# Patient Record
Sex: Female | Born: 1965 | Race: White | Hispanic: No | Marital: Married | Smoking: Current every day smoker
Health system: Southern US, Community
[De-identification: ages and names within clinical notes are randomized; demographics above are authoritative.]

## PROBLEM LIST (undated history)

## (undated) DIAGNOSIS — E079 Disorder of thyroid, unspecified: Secondary | ICD-10-CM

## (undated) DIAGNOSIS — G2581 Restless legs syndrome: Secondary | ICD-10-CM

## (undated) DIAGNOSIS — K589 Irritable bowel syndrome without diarrhea: Secondary | ICD-10-CM

## (undated) DIAGNOSIS — F419 Anxiety disorder, unspecified: Secondary | ICD-10-CM

## (undated) DIAGNOSIS — K56609 Unspecified intestinal obstruction, unspecified as to partial versus complete obstruction: Secondary | ICD-10-CM

## (undated) DIAGNOSIS — D259 Leiomyoma of uterus, unspecified: Secondary | ICD-10-CM

## (undated) HISTORY — DX: Leiomyoma of uterus, unspecified: D25.9

## (undated) HISTORY — DX: Unspecified intestinal obstruction, unspecified as to partial versus complete obstruction: K56.609

## (undated) HISTORY — PX: APPENDECTOMY: SHX54

## (undated) HISTORY — PX: CHOLECYSTECTOMY: SHX55

## (undated) HISTORY — DX: Disorder of thyroid, unspecified: E07.9

---

## 2004-03-25 ENCOUNTER — Ambulatory Visit (HOSPITAL_COMMUNITY): Admission: RE | Admit: 2004-03-25 | Discharge: 2004-03-25 | Payer: Self-pay | Admitting: Family Medicine

## 2004-03-31 ENCOUNTER — Emergency Department (HOSPITAL_COMMUNITY): Admission: EM | Admit: 2004-03-31 | Discharge: 2004-03-31 | Payer: Self-pay

## 2014-04-13 ENCOUNTER — Emergency Department (HOSPITAL_COMMUNITY)
Admission: EM | Admit: 2014-04-13 | Discharge: 2014-04-13 | Disposition: A | Discharge status: Home / Self Care | Payer: Medicaid Other | Attending: Emergency Medicine | Admitting: Emergency Medicine

## 2014-04-13 ENCOUNTER — Encounter (HOSPITAL_COMMUNITY): Payer: Self-pay | Admitting: Emergency Medicine

## 2014-04-13 DIAGNOSIS — M545 Low back pain, unspecified: Secondary | ICD-10-CM | POA: Diagnosis not present

## 2014-04-13 DIAGNOSIS — Z87891 Personal history of nicotine dependence: Secondary | ICD-10-CM | POA: Insufficient documentation

## 2014-04-13 DIAGNOSIS — M79609 Pain in unspecified limb: Secondary | ICD-10-CM | POA: Insufficient documentation

## 2014-04-13 DIAGNOSIS — B353 Tinea pedis: Secondary | ICD-10-CM | POA: Insufficient documentation

## 2014-04-13 HISTORY — DX: Irritable bowel syndrome, unspecified: K58.9

## 2014-04-13 MED ORDER — ONDANSETRON HCL 4 MG PO TABS
4.0000 mg | ORAL_TABLET | Freq: Once | ORAL | Status: AC
  Administered 2014-04-13: 4 mg via ORAL
  Filled 2014-04-13: qty 1

## 2014-04-13 MED ORDER — ACETAMINOPHEN-CODEINE #3 300-30 MG PO TABS
2.0000 | ORAL_TABLET | Freq: Once | ORAL | Status: AC
  Administered 2014-04-13: 2 via ORAL
  Filled 2014-04-13: qty 2

## 2014-04-13 MED ORDER — ACETAMINOPHEN-CODEINE #3 300-30 MG PO TABS: 1.0000 | ORAL_TABLET | Freq: Four times a day (QID) | ORAL | Status: DC | PRN

## 2014-04-13 NOTE — Discharge Instructions (Signed)
Your examination is consistent with athlete's foot involving the right foot. There's possibly some early secondary infection present. Please apply antifungal (Tinactin) powder 2 or 3 times during the day. Use antifungal cream to the area at night. Please apply a sock after you apply the cream at night. Please cleanse the area with soap and water daily. Please use clean white socks until the wound is completely healed.  Please see the orthopedic specialist listed above, or the orthopedic specialist of your choice for additional evaluations concerning your foot and your back. Use Tylenol for mild back pain, may use Tylenol codeine for more severe back pain. Tylenol codeine may cause drowsiness, please use with caution. Athlete's Foot Athlete's foot (tinea pedis) is a fungal infection of the skin on the feet. It often occurs on the skin between the toes or underneath the toes. It can also occur on the soles of the feet. Athlete's foot is more likely to occur in hot, humid weather. Not washing your feet or changing your socks often enough can contribute to athlete's foot. The infection can spread from person to person (contagious). CAUSES Athlete's foot is caused by a fungus. This fungus thrives in warm, moist places. Most people get athlete's foot by sharing shower stalls, towels, and wet floors with an infected person. People with weakened immune systems, including those with diabetes, may be more likely to get athlete's foot. SYMPTOMS   Itchy areas between the toes or on the soles of the feet.  White, flaky, or scaly areas between the toes or on the soles of the feet.  Tiny, intensely itchy blisters between the toes or on the soles of the feet.  Tiny cuts on the skin. These cuts can develop a bacterial infection.  Thick or discolored toenails. DIAGNOSIS  Your caregiver can usually tell what the problem is by doing a physical exam. Your caregiver may also take a skin sample from the rash area. The  skin sample may be examined under a microscope, or it may be tested to see if fungus will grow in the sample. A sample may also be taken from your toenail for testing. TREATMENT  Over-the-counter and prescription medicines can be used to kill the fungus. These medicines are available as powders or creams. Your caregiver can suggest medicines for you. Fungal infections respond slowly to treatment. You may need to continue using your medicine for several weeks. PREVENTION   Do not share towels.  Wear sandals in wet areas, such as shared locker rooms and shared showers.  Keep your feet dry. Wear shoes that allow air to circulate. Wear cotton or wool socks. HOME CARE INSTRUCTIONS   Take medicines as directed by your caregiver. Do not use steroid creams on athlete's foot.  Keep your feet clean and cool. Wash your feet daily and dry them thoroughly, especially between your toes.  Change your socks every day. Wear cotton or wool socks. In hot climates, you may need to change your socks 2 to 3 times per day.  Wear sandals or canvas tennis shoes with good air circulation.  If you have blisters, soak your feet in Burow's solution or Epsom salts for 20 to 30 minutes, 2 times a day to dry out the blisters. Make sure you dry your feet thoroughly afterward. SEEK MEDICAL CARE IF:   You have a fever.  You have swelling, soreness, warmth, or redness in your foot.  You are not getting better after 7 days of treatment.  You are not completely  cured after 30 days.  You have any problems caused by your medicines. MAKE SURE YOU:   Understand these instructions.  Will watch your condition.  Will get help right away if you are not doing well or get worse. Document Released: 08/07/2000 Document Revised: 11/02/2011 Document Reviewed: 05/29/2011 [facility] Patient Information 2015 [place], [place]. This information is not intended to replace advice given to you by your health care provider. Make sure you  discuss any questions you have with your health care provider.

## 2014-04-13 NOTE — ED Provider Notes (Signed)
CSN: [id number]     Arrival date & time 04/13/14  2032 History   First MD Initiated Contact with Patient 04/13/14 2115     Chief Complaint  Patient presents with  . Foot Pain     (Consider location/radiation/quality/duration/timing/severity/associated sxs/prior Treatment) HPI Comments: Patient is a 2 your old female who presents to the emergency department with a right foot and ankle pain for approximately one month. The patient states that she noted a small area of increased redness at the sole of her right foot. She has been trying alcohol, peroxide, various sprays and appointments, and the area remains red and itching for nearly a month. The patient is also concerned that at times she walks on her foot the foot on one side or the other, and it is now beginning to bother her back. The patient presents now for evaluation of the red area of her foot, and also to raise questions about why she walks away she does. There's been no drainage from the rib area. There's been no history of any temperature elevations. The patient denies seeing any red streaking involving the foot.  Patient is a 48 y.o. female presenting with lower extremity pain. The history is provided by the patient and the spouse.  Foot Pain Pertinent negatives include no abdominal pain, arthralgias, chest pain, coughing or neck pain.    Past Medical History  Diagnosis Date  . IBS (irritable bowel syndrome)    Past Surgical History  Procedure Laterality Date  . Cholecystectomy    . Appendectomy     No family history on file. History  Substance Use Topics  . Smoking status: Former Research scientist (life sciences)  . Smokeless tobacco: Not on file  . Alcohol Use: No   OB History   Grav Para Term Preterm Abortions TAB SAB Ect Mult Living                 Review of Systems  Constitutional: Negative for activity change.       All ROS Neg except as noted in HPI  HENT: Negative for nosebleeds.   Eyes: Negative for photophobia and discharge.   Respiratory: Negative for cough, shortness of breath and wheezing.   Cardiovascular: Negative for chest pain and palpitations.  Gastrointestinal: Negative for abdominal pain and blood in stool.  Genitourinary: Negative for dysuria, frequency and hematuria.  Musculoskeletal: Positive for back pain. Negative for arthralgias and neck pain.  Skin: Negative.   Neurological: Negative for dizziness, seizures and speech difficulty.  Psychiatric/Behavioral: Negative for hallucinations and confusion.      Allergies  Aspirin  Home Medications   Prior to Admission medications   Not on File   BP 133/62  Pulse 95  Temp(Src) 98.8 F (37.1 C)  Resp 20  Ht 5\' 9"  (1.753 m)  Wt 313 lb (141.976 kg)  BMI 46.20 kg/m2  SpO2 99% Physical Exam  Nursing note and vitals reviewed. Constitutional: She is oriented to person, place, and time. She appears well-developed and well-nourished.  Non-toxic appearance.  HENT:  Head: Normocephalic.  Right Ear: Tympanic membrane and external ear normal.  Left Ear: Tympanic membrane and external ear normal.  Eyes: EOM and lids are normal. Pupils are equal, round, and reactive to light.  Neck: Normal range of motion. Neck supple. Carotid bruit is not present.  Cardiovascular: Normal rate, regular rhythm, normal heart sounds, intact distal pulses and normal pulses.   Pulmonary/Chest: Breath sounds normal. No respiratory distress.  Abdominal: Soft. Bowel sounds are normal. There is  no tenderness. There is no guarding.  Musculoskeletal: Normal range of motion.       Lumbar back: She exhibits pain.  There is good range of motion of the right hip knee and ankle. The Achilles tendon is intact. The posterior tibial and dorsalis pedis pulses are 2+. There no lesions noted between the toes. The patient has callus formation on the metatarsal heads at the plantar surface. There is an area of increased redness at the midfoot. There is dry scaling areas at the center of the  red area, with raised red edges present. There is also some new skin granulation noted at the area. There is no red streaks appreciated. There is no drainage appreciated.  Lymphadenopathy:       Head (right side): No submandibular adenopathy present.       Head (left side): No submandibular adenopathy present.    She has no cervical adenopathy.  Neurological: She is alert and oriented to person, place, and time. She has normal strength. No cranial nerve deficit or sensory deficit.  Skin: Skin is warm and dry.  Psychiatric: She has a normal mood and affect. Her speech is normal.    ED Course  Procedures (including critical care time) Labs Review Labs Reviewed - No data to display  Imaging Review No results found.   EKG Interpretation None      MDM The examination is consistent with tinea pedis. There is no red streaking or drainage appreciated. The temperature is 98.8. There is no tachycardia present. I have suggested to the patient that she use antifungal powder 2 or 3 times daily to the area. I've also suggested that she use antifungal cream with a sock in place at night until this area resolves. The patient has pain of the lumbar area. There is no palpable step off, no hot areas appreciated. Patient is treated with Tylenol codeine, and advised to see her primary physician for additional evaluation of this problem.    Final diagnoses:  Tinea pedis of right foot  Low back pain without sciatica, unspecified back pain laterality    **I have reviewed nursing notes, vital signs, and all appropriate lab and imaging results for this patient.Lenox Ahr, PA-C 04/14/14 1610

## 2014-04-13 NOTE — ED Notes (Signed)
Patient with no complaints at this time. Respirations even and unlabored. Skin warm/dry. Discharge instructions reviewed with patient at this time. Patient given opportunity to voice concerns/ask questions. Patient discharged at this time and left Emergency Department with steady gait.   

## 2014-04-13 NOTE — ED Notes (Addendum)
Pt c/o right foot and ankle pain x 1 month. Pt has a red circular area on the sole of her foot.

## 2014-04-15 NOTE — ED Provider Notes (Signed)
Medical screening examination/treatment/procedure(s) were performed by non-physician practitioner and as supervising physician I was immediately available for consultation/collaboration.     Veryl Speak, MD 04/15/14 2039

## 2015-10-05 ENCOUNTER — Emergency Department (HOSPITAL_COMMUNITY)
Admission: EM | Admit: 2015-10-05 | Discharge: 2015-10-05 | Disposition: A | Discharge status: Home / Self Care | Payer: Medicaid Other | Attending: Emergency Medicine | Admitting: Emergency Medicine

## 2015-10-05 ENCOUNTER — Emergency Department (HOSPITAL_COMMUNITY): Payer: Medicaid Other

## 2015-10-05 ENCOUNTER — Encounter (HOSPITAL_COMMUNITY): Payer: Self-pay

## 2015-10-05 DIAGNOSIS — S20211A Contusion of right front wall of thorax, initial encounter: Secondary | ICD-10-CM

## 2015-10-05 DIAGNOSIS — Z791 Long term (current) use of non-steroidal anti-inflammatories (NSAID): Secondary | ICD-10-CM | POA: Insufficient documentation

## 2015-10-05 DIAGNOSIS — Z87891 Personal history of nicotine dependence: Secondary | ICD-10-CM | POA: Diagnosis not present

## 2015-10-05 DIAGNOSIS — Z79899 Other long term (current) drug therapy: Secondary | ICD-10-CM | POA: Diagnosis not present

## 2015-10-05 DIAGNOSIS — Y9389 Activity, other specified: Secondary | ICD-10-CM | POA: Insufficient documentation

## 2015-10-05 DIAGNOSIS — S29001A Unspecified injury of muscle and tendon of front wall of thorax, initial encounter: Secondary | ICD-10-CM | POA: Diagnosis present

## 2015-10-05 DIAGNOSIS — W010XXA Fall on same level from slipping, tripping and stumbling without subsequent striking against object, initial encounter: Secondary | ICD-10-CM | POA: Diagnosis not present

## 2015-10-05 DIAGNOSIS — Y998 Other external cause status: Secondary | ICD-10-CM | POA: Diagnosis not present

## 2015-10-05 DIAGNOSIS — Y9289 Other specified places as the place of occurrence of the external cause: Secondary | ICD-10-CM | POA: Diagnosis not present

## 2015-10-05 DIAGNOSIS — Z8719 Personal history of other diseases of the digestive system: Secondary | ICD-10-CM | POA: Insufficient documentation

## 2015-10-05 MED ORDER — OXYCODONE-ACETAMINOPHEN 5-325 MG PO TABS
1.0000 | ORAL_TABLET | Freq: Once | ORAL | Status: AC
  Administered 2015-10-05: 1 via ORAL
  Filled 2015-10-05: qty 1

## 2015-10-05 MED ORDER — HYDROCODONE-ACETAMINOPHEN 5-325 MG PO TABS: 1.0000 | ORAL_TABLET | ORAL | Status: DC | PRN

## 2015-10-05 NOTE — ED Notes (Signed)
I tripped over some shoes and fell. Having pain in my right ribs per pt.

## 2015-10-05 NOTE — ED Provider Notes (Signed)
CSN: [id number]     Arrival date & time 10/05/15  2117 History   First MD Initiated Contact with Patient 10/05/15 2210     Chief Complaint  Patient presents with  . Rib Injury     (Consider location/radiation/quality/duration/timing/severity/associated sxs/prior Treatment) Patient is a 50 y.o. female presenting with chest pain. The history is provided by the patient.  Chest Pain Pain location:  R chest Pain quality: aching   Pain radiates to:  Does not radiate Pain radiates to the back: no   Pain severity:  Moderate Onset quality:  Sudden Duration:  5 hours Timing:  Constant Progression:  Worsening Chronicity:  New Relieved by:  Nothing Worsened by:  Coughing, deep breathing and movement  Sharon Macdonald is a 50 y.o. female who presents to the ED with right rib pain. She reports tripping over shoes and falling on to her right side. Her right anterior ribs hit her husband's knee.    Past Medical History  Diagnosis Date  . IBS (irritable bowel syndrome)    Past Surgical History  Procedure Laterality Date  . Cholecystectomy    . Appendectomy     No family history on file. Social History  Substance Use Topics  . Smoking status: Former Research scientist (life sciences)  . Smokeless tobacco: None  . Alcohol Use: No   OB History    No data available     Review of Systems  Cardiovascular: Positive for chest pain (right rib pain).  all other systems negataive    Allergies  Aspirin  Home Medications   Prior to Admission medications   Medication Sig Start Date End Date Taking? Authorizing Provider  albuterol (PROVENTIL HFA;VENTOLIN HFA) 108 (90 Base) MCG/ACT inhaler Inhale 2 puffs into the lungs every 6 (six) hours as needed for wheezing or shortness of breath.   Yes Historical Provider, MD  gabapentin (NEURONTIN) 300 MG capsule Take 300 mg by mouth at bedtime.   Yes Historical Provider, MD  naproxen (NAPROSYN) 500 MG tablet Take 500 mg by mouth daily.   Yes Historical Provider, MD    HYDROcodone-acetaminophen (NORCO/VICODIN) 5-325 MG tablet Take 1 tablet by mouth every 4 (four) hours as needed. 10/05/15   Luie Laneve Bunnie Pion, NP   BP 116/69 mmHg  Pulse 78  Temp(Src) 98.1 F (36.7 C) (Oral)  Resp 24  Ht 5\' 9"  (1.753 m)  Wt 146.557 kg  BMI 47.69 kg/m2  SpO2 93%  LMP 08/30/2015 (Approximate) Physical Exam  Constitutional: She is oriented to person, place, and time. She appears well-developed and well-nourished.  HENT:  Head: Normocephalic and atraumatic.  Eyes: Conjunctivae and EOM are normal.  Neck: Normal range of motion. Neck supple.  Cardiovascular: Normal rate and regular rhythm.   Pulmonary/Chest: Effort normal and breath sounds normal. She exhibits tenderness. She exhibits no laceration, no deformity and no swelling.    Abdominal: Soft. Bowel sounds are normal. There is no tenderness.  Musculoskeletal: Normal range of motion.  Neurological: She is alert and oriented to person, place, and time. She has normal strength. No cranial nerve deficit. Gait normal.  Skin: Skin is warm and dry.  Psychiatric: She has a normal mood and affect. Her behavior is normal.  Nursing note and vitals reviewed.   ED Course  Procedures (including critical care time) Labs Review Labs Reviewed - No data to display  Imaging Review Dg Ribs Unilateral W/chest Right  10/05/2015  CLINICAL DATA:  Tripped over spouse's foot and fell, hitting husband's knee. RIGHT rib pain. EXAM: RIGHT  RIBS AND CHEST - 3+ VIEW COMPARISON:  Thoracic spine radiographs July 31, 2015 FINDINGS: No fracture or other bone lesions are seen involving the ribs. There is no evidence of pneumothorax or pleural effusion. Both lungs are clear. Heart size and mediastinal contours are within normal limits. Surgical clips in the included right abdomen compatible with cholecystectomy. IMPRESSION: Negative. Electronically Signed   By: Elon Alas M.D.   On: 10/05/2015 22:29    MDM  51 y.o. female with right rib pain  s/p fall earlier today stable for d/c without fracture identified on x-ray and no respiratory distress, normal breath sound. Discussed with the patient and all questioned fully answered. She will return if any problems arise. Will treat with Hydrocodone for pain and she will continue to take the Naprosyn.   Final diagnoses:  Rib contusion, right, initial encounter      [facility], NP 10/05/15 2303  Virgel Manifold, MD 10/08/15 1339

## 2015-10-05 NOTE — Discharge Instructions (Signed)
Do not drive while taking the narcotic as it will make you sleepy. Continue to take the Naprosyn.

## 2016-04-27 ENCOUNTER — Encounter (HOSPITAL_COMMUNITY): Payer: Self-pay | Admitting: Emergency Medicine

## 2016-04-27 ENCOUNTER — Emergency Department (HOSPITAL_COMMUNITY)
Admission: EM | Admit: 2016-04-27 | Discharge: 2016-04-27 | Disposition: A | Discharge status: Home / Self Care | Payer: Medicaid Other | Attending: Emergency Medicine | Admitting: Emergency Medicine

## 2016-04-27 DIAGNOSIS — Z79899 Other long term (current) drug therapy: Secondary | ICD-10-CM | POA: Insufficient documentation

## 2016-04-27 DIAGNOSIS — M79604 Pain in right leg: Secondary | ICD-10-CM | POA: Diagnosis not present

## 2016-04-27 DIAGNOSIS — F17210 Nicotine dependence, cigarettes, uncomplicated: Secondary | ICD-10-CM | POA: Insufficient documentation

## 2016-04-27 DIAGNOSIS — H9203 Otalgia, bilateral: Secondary | ICD-10-CM | POA: Insufficient documentation

## 2016-04-27 DIAGNOSIS — M79605 Pain in left leg: Secondary | ICD-10-CM | POA: Insufficient documentation

## 2016-04-27 HISTORY — DX: Restless legs syndrome: G25.81

## 2016-04-27 MED ORDER — DEXAMETHASONE SODIUM PHOSPHATE 4 MG/ML IJ SOLN
8.0000 mg | Freq: Once | INTRAMUSCULAR | Status: AC
  Administered 2016-04-27: 8 mg via INTRAMUSCULAR
  Filled 2016-04-27: qty 2

## 2016-04-27 MED ORDER — DIPHENHYDRAMINE HCL 25 MG PO CAPS: 25.0000 mg | ORAL_CAPSULE | Freq: Four times a day (QID) | ORAL | 0 refills | Status: DC | PRN

## 2016-04-27 MED ORDER — TRAMADOL HCL 50 MG PO TABS
50.0000 mg | ORAL_TABLET | Freq: Once | ORAL | Status: AC
Start: 2016-04-27 — End: 2016-04-27
  Administered 2016-04-27: 50 mg via ORAL
  Filled 2016-04-27: qty 1

## 2016-04-27 MED ORDER — DIPHENHYDRAMINE HCL 25 MG PO CAPS
25.0000 mg | ORAL_CAPSULE | Freq: Once | ORAL | Status: AC
  Administered 2016-04-27: 25 mg via ORAL
  Filled 2016-04-27: qty 1

## 2016-04-27 MED ORDER — TRAMADOL HCL 50 MG PO TABS: 50.0000 mg | ORAL_TABLET | Freq: Four times a day (QID) | ORAL | 0 refills | Status: DC | PRN

## 2016-04-27 NOTE — ED Provider Notes (Signed)
[place] DEPT Provider Note   CSN: [id number] Arrival date & time: 04/27/16  1347  By signing my name below, I, Shanna Cisco, attest that this documentation has been prepared under the direction and in the presence of Lily Kocher, PA-C. Electronically signed by: Shanna Cisco, ED Scribe. 04/27/16. 3:37 PM.   History   Chief Complaint Chief Complaint  Patient presents with  . Leg Pain  . Otalgia   The history is provided by the patient. No language interpreter was used.   HPI Comments:  Sharon Macdonald is a 50 y.o. female with a PMHx of restless leg syndrome who presents to the Emergency Department complaining of bilateral leg pain, which started several days ago. Pain is described as burning sensation. No associated symptoms reported. She reports that pain is exacerbated with weight bearing. Pt is prescribed Gabapentin, which does not provide relief. Denies numbness or tingling, recent fever, chills or injuries.  Pt also complains of bilateral otalgia, which started 2 days ago. History of allergies and sinus problems. She believes that she may have a sore in the ear. No associated symptoms or modifying factors reported. Denies fever, drainage.   Past Medical History:  Diagnosis Date  . IBS (irritable bowel syndrome)   . Restless leg syndrome     There are no active problems to display for this patient.   Past Surgical History:  Procedure Laterality Date  . APPENDECTOMY    . CHOLECYSTECTOMY      OB History    Gravida Para Term Preterm AB Living   4 4 4          SAB TAB Ectopic Multiple Live Births                   Home Medications    Prior to Admission medications   Medication Sig Start Date End Date Taking? Authorizing Provider  albuterol (PROVENTIL HFA;VENTOLIN HFA) 108 (90 Base) MCG/ACT inhaler Inhale 2 puffs into the lungs every 6 (six) hours as needed for wheezing or shortness of breath.    Historical Provider, MD  gabapentin (NEURONTIN) 300 MG capsule Take  300 mg by mouth at bedtime.    Historical Provider, MD  HYDROcodone-acetaminophen (NORCO/VICODIN) 5-325 MG tablet Take 1 tablet by mouth every 4 (four) hours as needed. 10/05/15   Hope Bunnie Pion, NP  naproxen (NAPROSYN) 500 MG tablet Take 500 mg by mouth daily.    Historical Provider, MD    Family History Family History  Problem Relation Age of Onset  . Cancer Mother     Social History Social History  Substance Use Topics  . Smoking status: Current Every Day Smoker    Types: E-cigarettes  . Smokeless tobacco: Never Used  . Alcohol use No     Allergies   Aspirin   Review of Systems Review of Systems  Constitutional: Negative for chills and fever.  Musculoskeletal: Positive for arthralgias and myalgias.  Neurological: Negative for weakness.  All other systems reviewed and are negative.    Physical Exam Updated Vital Signs BP 148/73 (BP Location: Left Arm)   Pulse 65   Temp 98.1 F (36.7 C) (Oral)   Resp 18   Ht 5\' 9"  (1.753 m)   Wt (!) 305 lb 8 oz (138.6 kg)   LMP 08/30/2015 (Approximate)   SpO2 96%   BMI 45.11 kg/m   Physical Exam  Constitutional: She is oriented to person, place, and time. She appears well-developed and well-nourished.  HENT:  Head: Normocephalic and  atraumatic.  Mouth/Throat: Oropharynx is clear and moist.  No pre or post auricular nodes palpated. Right TM is within normal limits.  Mild to moderate fluid behind TM in the left ear. Tenderness over TMJ on right. No TMJ tenderness on left.   Eyes: Conjunctivae and EOM are normal. Pupils are equal, round, and reactive to light.  Neck: Normal range of motion.  Cardiovascular: Normal rate, regular rhythm and normal heart sounds.   Pulses:      Dorsalis pedis pulses are 2+ on the right side, and 2+ on the left side.  Pulmonary/Chest: Effort normal and breath sounds normal.  Abdominal: Soft. Bowel sounds are normal.  Musculoskeletal: Normal range of motion.  No temperature changes of lower  extremities. Crepitus with ROM of right and left knee. No effusion; no hot joints; no posterior masses.  Lymphadenopathy:    She has no cervical adenopathy.  Neurological: She is alert and oriented to person, place, and time.  Skin: Skin is warm and dry.  Psychiatric: She has a normal mood and affect.  Nursing note and vitals reviewed.   ED Treatments / Results  DIAGNOSTIC STUDIES:  Oxygen Saturation is 96% on room air, normal by my interpretation.    COORDINATION OF CARE:  3:35 PM Discussed treatment plan with pt at bedside and pt agreed to plan.  Labs (all labs ordered are listed, but only abnormal results are displayed) Labs Reviewed - No data to display  EKG  EKG Interpretation None       Radiology No results found.  Procedures Procedures (including critical care time)  Medications Ordered in ED Medications - No data to display   Initial Impression / Assessment and Plan / ED Course  I have reviewed the triage vital signs and the nursing notes.  Pertinent labs & imaging results that were available during my care of the patient were reviewed by me and considered in my medical decision making (see chart for details).  Clinical Course    **I have reviewed nursing notes, vital signs, and all appropriate lab and imaging results for this patient.*  Final Clinical Impressions(s) / ED Diagnoses  The vital signs within normal limits. The examination suggest bilateral otalgia, and right leg pain. I suspect that the patient has advancing degenerative joint disease that is aggravating restless leg syndrome. Patient advised to see her Medicaid access physician for additional evaluation and management. Patient is to return to the emergency department if any changes, or emergent  Problem. Pt to see Dr Benjamine Mola if not improving. Patient is in agreement with this discharge plan.    Final diagnoses:  Otalgia, bilateral  Right leg pain    New Prescriptions New Prescriptions     No medications on file  **I personally performed the services described in this documentation, which was scribed in my presence. The recorded information has been reviewed and is accurate.Lily Kocher, PA-C 04/30/16 [place], MD 05/01/16 [phone]

## 2016-04-27 NOTE — ED Notes (Signed)
Pt reports bilateral leg pain, hx of RLS. "My medication doesn't work anymore". Also c/o bilateral ear pain X2 months.

## 2016-04-27 NOTE — ED Triage Notes (Signed)
Pt c/o leg pain, states she has restless leg syndrome. Pt also c/o bilat otalgia.

## 2016-04-27 NOTE — Discharge Instructions (Addendum)
There is a mild to moderate amount of fluid behind the ear drum on the left. There is soreness over your right and left temporomandibular joints on. Please use in a drill for the congestion and fluid. Please use Tylenol for mild pain, use Ultram for more severe pain. The examination of your lower extremities reveals advanced arthritis changes. You were given intramuscular Decadron to assist with this discomfort on. Please discuss this problem and your restless leg issues with Dr. Lorriane Shire as soon as possible.

## 2017-01-11 ENCOUNTER — Emergency Department (HOSPITAL_COMMUNITY)
Admission: EM | Admit: 2017-01-11 | Discharge: 2017-01-11 | Disposition: A | Discharge status: Home / Self Care | Payer: Medicaid Other | Attending: Emergency Medicine | Admitting: Emergency Medicine

## 2017-01-11 ENCOUNTER — Encounter (HOSPITAL_COMMUNITY): Payer: Self-pay

## 2017-01-11 DIAGNOSIS — R197 Diarrhea, unspecified: Secondary | ICD-10-CM | POA: Diagnosis not present

## 2017-01-11 DIAGNOSIS — R110 Nausea: Secondary | ICD-10-CM | POA: Diagnosis not present

## 2017-01-11 DIAGNOSIS — Z79899 Other long term (current) drug therapy: Secondary | ICD-10-CM | POA: Diagnosis not present

## 2017-01-11 DIAGNOSIS — F17210 Nicotine dependence, cigarettes, uncomplicated: Secondary | ICD-10-CM | POA: Diagnosis not present

## 2017-01-11 DIAGNOSIS — R1031 Right lower quadrant pain: Secondary | ICD-10-CM | POA: Insufficient documentation

## 2017-01-11 DIAGNOSIS — R748 Abnormal levels of other serum enzymes: Secondary | ICD-10-CM | POA: Insufficient documentation

## 2017-01-11 DIAGNOSIS — R109 Unspecified abdominal pain: Secondary | ICD-10-CM

## 2017-01-11 LAB — COMPREHENSIVE METABOLIC PANEL
ALBUMIN: 3.7 g/dL (ref 3.5–5.0)
ALT: 99 U/L — AB (ref 14–54)
AST: 152 U/L — AB (ref 15–41)
Alkaline Phosphatase: 143 U/L — ABNORMAL HIGH (ref 38–126)
Anion gap: 9 (ref 5–15)
BILIRUBIN TOTAL: 2 mg/dL — AB (ref 0.3–1.2)
BUN: 7 mg/dL (ref 6–20)
CO2: 24 mmol/L (ref 22–32)
Calcium: 8.5 mg/dL — ABNORMAL LOW (ref 8.9–10.3)
Chloride: 98 mmol/L — ABNORMAL LOW (ref 101–111)
Creatinine, Ser: 0.73 mg/dL (ref 0.44–1.00)
GFR calc Af Amer: >60 mL/min (ref >60)
GLUCOSE: 102 mg/dL — AB (ref 65–99)
POTASSIUM: 4 mmol/L (ref 3.5–5.1)
Sodium: 131 mmol/L — ABNORMAL LOW (ref 135–145)
Total Protein: 8.1 g/dL (ref 6.5–8.1)

## 2017-01-11 LAB — CBC
HEMATOCRIT: 39.3 % (ref 36.0–46.0)
HEMOGLOBIN: 13.5 g/dL (ref 12.0–15.0)
MCH: 30.3 pg (ref 26.0–34.0)
MCHC: 34.4 g/dL (ref 30.0–36.0)
MCV: 88.3 fL (ref 78.0–100.0)
Platelets: 197 10*3/uL (ref 150–400)
RBC: 4.45 MIL/uL (ref 3.87–5.11)
RDW: 13.1 % (ref 11.5–15.5)
WBC: 10.6 10*3/uL — ABNORMAL HIGH (ref 4.0–10.5)

## 2017-01-11 LAB — ACETAMINOPHEN LEVEL: Acetaminophen (Tylenol), Serum: <10 ug/mL — ABNORMAL LOW (ref 10–30)

## 2017-01-11 LAB — LIPASE, BLOOD: Lipase: 40 U/L (ref 11–51)

## 2017-01-11 MED ORDER — DICYCLOMINE HCL 10 MG PO CAPS
10.0000 mg | ORAL_CAPSULE | Freq: Once | ORAL | Status: AC
  Administered 2017-01-11: 10 mg via ORAL
  Filled 2017-01-11: qty 1

## 2017-01-11 MED ORDER — ONDANSETRON HCL 4 MG PO TABS
4.0000 mg | ORAL_TABLET | Freq: Once | ORAL | Status: AC
  Administered 2017-01-11: 4 mg via ORAL
  Filled 2017-01-11: qty 1

## 2017-01-11 MED ORDER — DIPHENOXYLATE-ATROPINE 2.5-0.025 MG PO TABS
1.0000 | ORAL_TABLET | Freq: Once | ORAL | Status: AC
  Administered 2017-01-11: 1 via ORAL
  Filled 2017-01-11: qty 1

## 2017-01-11 MED ORDER — DICYCLOMINE HCL 20 MG PO TABS: 20.0000 mg | ORAL_TABLET | Freq: Two times a day (BID) | ORAL | 0 refills | Status: DC | PRN

## 2017-01-11 MED ORDER — ONDANSETRON HCL 4 MG/2ML IJ SOLN: 4.0000 mg | Freq: Once | INTRAMUSCULAR | Status: DC

## 2017-01-11 MED ORDER — ONDANSETRON HCL 4 MG PO TABS: 4.0000 mg | ORAL_TABLET | Freq: Three times a day (TID) | ORAL | 0 refills | Status: DC | PRN

## 2017-01-11 NOTE — ED Provider Notes (Signed)
[facility] DEPT Provider Note   CSN: [id number] Arrival date & time: 01/11/17  2053 By signing my name below, I, Sharon Macdonald, attest that this documentation has been prepared under the direction and in the presence of Sharon Macdonald, Corene Cornea, MD . Electronically Signed: Dyke Macdonald, Scribe. 01/11/2017. 9:32 PM.   History   Chief Complaint Chief Complaint  Patient presents with  . Abdominal Pain  . Diarrhea    HPI Sharon Macdonald is a 51 y.o. female with a history of IBS who presents to the Emergency Department complaining of intermittent, moderate RLQ pain onset this morning. Pt reports associated nausea and frequent NBNB diarrhea. Per pt, she has had over 10 episodes of diarrhea today.  Pt states her abdominal pain is alleviated by having bowel movements and is exacerbated by direct palpation. She has taken 3 Imodium today with no relief. Per pt, her IBS symptoms are typically relieved after taking Imodium. No recent sick contact. She denies the use of any anti-coagulants. No recent foreign travel. No suspicious food intake. No recent abx use. She denies any vomiting or fever.   The history is provided by the patient. No language interpreter was used.   Past Medical History:  Diagnosis Date  . IBS (irritable bowel syndrome)   . Restless leg syndrome     There are no active problems to display for this patient.  Past Surgical History:  Procedure Laterality Date  . APPENDECTOMY    . CHOLECYSTECTOMY      OB History    Gravida Para Term Preterm AB Living   4 4 4          SAB TAB Ectopic Multiple Live Births                 Home Medications    Prior to Admission medications   Medication Sig Start Date End Date Taking? Authorizing Provider  albuterol (PROAIR HFA) 108 (90 Base) MCG/ACT inhaler Inhale 1-2 puffs into the lungs every 6 (six) hours as needed for wheezing or shortness of breath.   Yes [provider]  gabapentin (NEURONTIN) 300 MG capsule Take 300 mg by  mouth at bedtime.   Yes [provider]  loperamide (IMODIUM A-D) 2 MG tablet Take 2 mg by mouth 4 (four) times daily as needed for diarrhea or loose stools.   Yes [provider]  dicyclomine (BENTYL) 20 MG tablet Take 1 tablet (20 mg total) by mouth 2 (two) times daily as needed for spasms. 01/11/17   Oluwadamilola Rosamond, Corene Cornea, MD  ondansetron (ZOFRAN) 4 MG tablet Take 1 tablet (4 mg total) by mouth every 8 (eight) hours as needed for nausea or vomiting. 01/11/17   Autrey Human, Corene Cornea, MD    Family History Family History  Problem Relation Age of Onset  . Cancer Mother     Social History Social History  Substance Use Topics  . Smoking status: Current Every Day Smoker    Types: E-cigarettes  . Smokeless tobacco: Never Used  . Alcohol use No    Allergies   Aspirin  Review of Systems Review of Systems  Constitutional: Negative for fever.  Gastrointestinal: Positive for abdominal pain, diarrhea and nausea. Negative for vomiting.  All other systems reviewed and are negative.  Physical Exam Updated Vital Signs BP (!) 133/54   Pulse 88   Temp 99.1 F (37.3 C) (Oral)   Resp 17   Wt 134.2 kg (295 lb 13.7 oz)   LMP 08/30/2015 (Approximate)   SpO2 97%  BMI 43.69 kg/m   Physical Exam  Constitutional: She is oriented to person, place, and time. She appears well-developed and well-nourished. No distress.  HENT:  Head: Normocephalic and atraumatic.  Moist mucus membranes  Eyes: Conjunctivae are normal.  Cardiovascular: Normal rate.   Pulmonary/Chest: Effort normal.  Abdominal: Soft. Bowel sounds are normal. She exhibits no distension and no mass. There is no tenderness. There is no rebound and no guarding.  Musculoskeletal: Normal range of motion. She exhibits no edema or deformity.  Neurological: She is alert and oriented to person, place, and time.  Skin: Skin is warm and dry.  Psychiatric: She has a normal mood and affect.  Nursing note and vitals reviewed.  ED  Treatments / Results  DIAGNOSTIC STUDIES:  Oxygen Saturation is 100% on RA, normal by my interpretation.    COORDINATION OF CARE:  9:28 PM Discussed treatment plan with pt at bedside and pt agreed to plan.   Labs (all labs ordered are listed, but only abnormal results are displayed) Labs Reviewed  COMPREHENSIVE METABOLIC PANEL - Abnormal; Notable for the following:       Result Value   Sodium 131 (*)    Chloride 98 (*)    Glucose, Bld 102 (*)    Calcium 8.5 (*)    AST 152 (*)    ALT 99 (*)    Alkaline Phosphatase 143 (*)    Total Bilirubin 2.0 (*)    All other components within normal limits  CBC - Abnormal; Notable for the following:    WBC 10.6 (*)    All other components within normal limits  ACETAMINOPHEN LEVEL - Abnormal; Notable for the following:    Acetaminophen (Tylenol), Serum <10 (*)    All other components within normal limits  LIPASE, BLOOD  URINALYSIS, ROUTINE W REFLEX MICROSCOPIC  HEPATITIS PANEL, ACUTE    EKG  EKG Interpretation None       Radiology No results found.  Procedures Procedures (including critical care time)  Medications Ordered in ED Medications  dicyclomine (BENTYL) capsule 10 mg (10 mg Oral Given 01/11/17 2150)  diphenoxylate-atropine (LOMOTIL) 2.5-0.025 MG per tablet 1 tablet (1 tablet Oral Given 01/11/17 2150)  ondansetron (ZOFRAN) tablet 4 mg (4 mg Oral Given 01/11/17 2150)     Initial Impression / Assessment and Plan / ED Course  I have reviewed the triage vital signs and the nursing notes.  Pertinent labs & imaging results that were available during my care of the patient were reviewed by me and considered in my medical decision making (see chart for details).   Suspect possibly hepatitis A as her liver enzymes are elevated as well. Hepatitis panel added on. Symptoms improved significantly with lomotil, bentyl, zofran. Will dc on same.   Final Clinical Impressions(s) / ED Diagnoses   Final diagnoses:  Diarrhea,  unspecified type  Elevated liver enzymes  Abdominal pain, unspecified abdominal location    New Prescriptions Discharge Medication List as of 01/11/2017 11:22 PM    START taking these medications   Details  dicyclomine (BENTYL) 20 MG tablet Take 1 tablet (20 mg total) by mouth 2 (two) times daily as needed for spasms., Starting Mon 01/11/2017, Print    ondansetron (ZOFRAN) 4 MG tablet Take 1 tablet (4 mg total) by mouth every 8 (eight) hours as needed for nausea or vomiting., Starting Mon 01/11/2017, Print       I personally performed the services described in this documentation, which was scribed in my presence. The recorded information  has been reviewed and is accurate.    Dionisio Aragones, Corene Cornea, MD 01/12/17 [phone]

## 2017-01-11 NOTE — ED Notes (Signed)
Pt has not had BM in one hour. States she feels better. MD notified.

## 2017-01-11 NOTE — ED Notes (Signed)
Pt not able to urinate at this time

## 2017-01-11 NOTE — ED Triage Notes (Signed)
Reports of RLQ pain that started this morning with diarrhea. Reports over 10 episodes of diarrhea. Has taken 3 imodium today with no relief.

## 2017-01-13 LAB — HEPATITIS PANEL, ACUTE
HCV Ab: <0.1 s/co ratio (ref 0.0–0.9)
HEP A IGM: NEGATIVE
HEP B S AG: NEGATIVE
Hep B C IgM: NEGATIVE

## 2017-07-13 ENCOUNTER — Emergency Department (HOSPITAL_COMMUNITY)
Admission: EM | Admit: 2017-07-13 | Discharge: 2017-07-13 | Disposition: A | Discharge status: Home / Self Care | Payer: Medicaid Other | Attending: Emergency Medicine | Admitting: Emergency Medicine

## 2017-07-13 ENCOUNTER — Encounter (HOSPITAL_COMMUNITY): Payer: Self-pay | Admitting: Emergency Medicine

## 2017-07-13 DIAGNOSIS — Z79899 Other long term (current) drug therapy: Secondary | ICD-10-CM | POA: Insufficient documentation

## 2017-07-13 DIAGNOSIS — F17200 Nicotine dependence, unspecified, uncomplicated: Secondary | ICD-10-CM | POA: Diagnosis not present

## 2017-07-13 DIAGNOSIS — F419 Anxiety disorder, unspecified: Secondary | ICD-10-CM

## 2017-07-13 DIAGNOSIS — R197 Diarrhea, unspecified: Secondary | ICD-10-CM

## 2017-07-13 DIAGNOSIS — R109 Unspecified abdominal pain: Secondary | ICD-10-CM | POA: Diagnosis not present

## 2017-07-13 HISTORY — DX: Anxiety disorder, unspecified: F41.9

## 2017-07-13 LAB — URINALYSIS, ROUTINE W REFLEX MICROSCOPIC
BILIRUBIN URINE: NEGATIVE
Glucose, UA: NEGATIVE mg/dL
Hgb urine dipstick: NEGATIVE
KETONES UR: NEGATIVE mg/dL
LEUKOCYTES UA: NEGATIVE
NITRITE: NEGATIVE
PH: 5 (ref 5.0–8.0)
PROTEIN: NEGATIVE mg/dL
Specific Gravity, Urine: 1.01 (ref 1.005–1.030)

## 2017-07-13 LAB — CBC WITH DIFFERENTIAL/PLATELET
BASOS ABS: 0 10*3/uL (ref 0.0–0.1)
Basophils Relative: 0 %
Eosinophils Absolute: 0.2 10*3/uL (ref 0.0–0.7)
Eosinophils Relative: 2 %
HEMATOCRIT: 37.3 % (ref 36.0–46.0)
HEMOGLOBIN: 12.2 g/dL (ref 12.0–15.0)
Lymphocytes Relative: 16 %
Lymphs Abs: 1.5 10*3/uL (ref 0.7–4.0)
MCH: 29.8 pg (ref 26.0–34.0)
MCHC: 32.7 g/dL (ref 30.0–36.0)
MCV: 91 fL (ref 78.0–100.0)
Monocytes Absolute: 0.8 10*3/uL (ref 0.1–1.0)
Monocytes Relative: 9 %
NEUTROS ABS: 7.1 10*3/uL (ref 1.7–7.7)
NEUTROS PCT: 73 %
Platelets: 218 10*3/uL (ref 150–400)
RBC: 4.1 MIL/uL (ref 3.87–5.11)
RDW: 12.7 % (ref 11.5–15.5)
WBC: 9.7 10*3/uL (ref 4.0–10.5)

## 2017-07-13 LAB — COMPREHENSIVE METABOLIC PANEL
ALK PHOS: 113 U/L (ref 38–126)
ALT: 29 U/L (ref 14–54)
AST: 24 U/L (ref 15–41)
Albumin: 3.6 g/dL (ref 3.5–5.0)
Anion gap: 9 (ref 5–15)
BUN: 11 mg/dL (ref 6–20)
CHLORIDE: 103 mmol/L (ref 101–111)
CO2: 25 mmol/L (ref 22–32)
CREATININE: 0.85 mg/dL (ref 0.44–1.00)
Calcium: 9.2 mg/dL (ref 8.9–10.3)
GFR calc Af Amer: >60 mL/min (ref >60)
Glucose, Bld: 128 mg/dL — ABNORMAL HIGH (ref 65–99)
Potassium: 3.7 mmol/L (ref 3.5–5.1)
Sodium: 137 mmol/L (ref 135–145)
Total Bilirubin: 0.7 mg/dL (ref 0.3–1.2)
Total Protein: 7.8 g/dL (ref 6.5–8.1)

## 2017-07-13 LAB — TROPONIN I

## 2017-07-13 LAB — LIPASE, BLOOD: Lipase: 33 U/L (ref 11–51)

## 2017-07-13 LAB — PREGNANCY, URINE: Preg Test, Ur: NEGATIVE

## 2017-07-13 MED ORDER — DICYCLOMINE HCL 10 MG PO CAPS
10.0000 mg | ORAL_CAPSULE | Freq: Once | ORAL | Status: AC
  Administered 2017-07-13: 10 mg via ORAL
  Filled 2017-07-13: qty 1

## 2017-07-13 MED ORDER — DICYCLOMINE HCL 20 MG PO TABS: 20.0000 mg | ORAL_TABLET | Freq: Two times a day (BID) | ORAL | 0 refills | Status: DC

## 2017-07-13 MED ORDER — ONDANSETRON 4 MG PO TBDP
4.0000 mg | ORAL_TABLET | Freq: Once | ORAL | Status: AC
  Administered 2017-07-13: 4 mg via ORAL
  Filled 2017-07-13: qty 1

## 2017-07-13 MED ORDER — ONDANSETRON 4 MG PO TBDP: 4.0000 mg | ORAL_TABLET | Freq: Three times a day (TID) | ORAL | 0 refills | Status: DC | PRN

## 2017-07-13 NOTE — ED Notes (Addendum)
Water given   Pt reports no nausea, no pain at this time

## 2017-07-13 NOTE — ED Triage Notes (Signed)
Pt reports abd pain since 0000 with dry heaves, no v/d

## 2017-07-13 NOTE — Discharge Instructions (Signed)
Take the medicine for nausea and abdominal cramping as prescribed.  Follow-up with your primary doctor as well as a gastroenterologist.  Return to the ED if you develop new or worsening symptoms.

## 2017-07-13 NOTE — ED Provider Notes (Signed)
[facility] EMERGENCY DEPARTMENT Provider Note   CSN: [id number] Arrival date & time: 07/13/17  0444     History   Chief Complaint Chief Complaint  Patient presents with  . Abdominal Pain    HPI Sharon Macdonald is a 51 y.o. female.  Patient with history of IBS and anxiety presenting with abdominal pain, diarrhea and dry heaves since 11.  Reports that she has had 6-8 episodes of loose stools that have been nonbloody.  She does have diarrhea at baseline with her IBS every time she eats but this is more than usual.  Associated with worsening right-sided abdominal pain and episodes of dry heaving but no vomiting.  Continues to complain of nausea and right-sided abdominal pain that is typical of her previous episodes of IBS.  Previous appendectomy and cholecystectomy.  No fever.  No pain with urination, vaginal bleeding or vaginal discharge.   The history is provided by the patient.  Abdominal Pain   Associated symptoms include diarrhea and nausea. Pertinent negatives include fever, vomiting, dysuria, hematuria, headaches, arthralgias and myalgias.    Past Medical History:  Diagnosis Date  . Anxiety 07/13/2017  . IBS (irritable bowel syndrome)   . Restless leg syndrome     There are no active problems to display for this patient.   Past Surgical History:  Procedure Laterality Date  . APPENDECTOMY    . CHOLECYSTECTOMY      OB History    Gravida Para Term Preterm AB Living   4 4 4          SAB TAB Ectopic Multiple Live Births                   Home Medications    Prior to Admission medications   Medication Sig Start Date End Date Taking? Authorizing Provider  gabapentin (NEURONTIN) 300 MG capsule Take 300 mg by mouth at bedtime.   Yes [provider]  loperamide (IMODIUM A-D) 2 MG tablet Take 2 mg by mouth 4 (four) times daily as needed for diarrhea or loose stools.   Yes [provider]  albuterol (PROAIR HFA) 108 (90 Base) MCG/ACT inhaler Inhale  1-2 puffs into the lungs every 6 (six) hours as needed for wheezing or shortness of breath.    [provider]  dicyclomine (BENTYL) 20 MG tablet Take 1 tablet (20 mg total) by mouth 2 (two) times daily as needed for spasms. 01/11/17   Mesner, Corene Cornea, MD  ondansetron (ZOFRAN) 4 MG tablet Take 1 tablet (4 mg total) by mouth every 8 (eight) hours as needed for nausea or vomiting. 01/11/17   Mesner, Corene Cornea, MD    Family History Family History  Problem Relation Age of Onset  . Cancer Mother     Social History Social History   Tobacco Use  . Smoking status: Current Every Day Smoker    Types: E-cigarettes  . Smokeless tobacco: Never Used  Substance Use Topics  . Alcohol use: No  . Drug use: No     Allergies   Aspirin   Review of Systems Review of Systems  Constitutional: Positive for activity change and appetite change. Negative for fatigue and fever.  HENT: Negative for congestion and rhinorrhea.   Respiratory: Negative for cough, chest tightness and shortness of breath.   Cardiovascular: Negative for chest pain.  Gastrointestinal: Positive for abdominal pain, diarrhea and nausea. Negative for vomiting.  Genitourinary: Negative for dysuria, hematuria, vaginal bleeding and vaginal discharge.  Musculoskeletal: Negative for arthralgias and  myalgias.  Skin: Negative for rash.  Neurological: Negative for dizziness, weakness and headaches.   all other systems are negative except as noted in the HPI and PMH.     Physical Exam Updated Vital Signs BP (!) 109/93 (BP Location: Left Arm)   Pulse 87   Temp 98 F (36.7 C) (Oral)   Resp 18   Ht 5\' 8"  (1.727 m)   Wt 136.1 kg (300 lb)   LMP 08/30/2015 (Approximate)   SpO2 98%   BMI 45.61 kg/m   Physical Exam  Constitutional: She is oriented to person, place, and time. She appears well-developed and well-nourished. No distress.  HENT:  Head: Normocephalic and atraumatic.  Mouth/Throat: Oropharynx is clear and moist. No  oropharyngeal exudate.  Eyes: Conjunctivae and EOM are normal. Pupils are equal, round, and reactive to light.  Neck: Normal range of motion. Neck supple.  No meningismus.  Cardiovascular: Normal rate, regular rhythm, normal heart sounds and intact distal pulses.  No murmur heard. Pulmonary/Chest: Effort normal and breath sounds normal. No respiratory distress.  Abdominal: Soft. There is tenderness. There is no rebound and no guarding.  Obese. Mild R sided tenderness. No guarding or rebound  Musculoskeletal: Normal range of motion. She exhibits no edema or tenderness.  Neurological: She is alert and oriented to person, place, and time. No cranial nerve deficit. She exhibits normal muscle tone. Coordination normal.   5/5 strength throughout. CN 2-12 intact.Equal grip strength.   Skin: Skin is warm.  Psychiatric: She has a normal mood and affect. Her behavior is normal.  Nursing note and vitals reviewed.    ED Treatments / Results  Labs (all labs ordered are listed, but only abnormal results are displayed) Labs Reviewed  COMPREHENSIVE METABOLIC PANEL - Abnormal; Notable for the following components:      Result Value   Glucose, Bld 128 (*)    All other components within normal limits  URINALYSIS, ROUTINE W REFLEX MICROSCOPIC  PREGNANCY, URINE  TROPONIN I  CBC WITH DIFFERENTIAL/PLATELET  LIPASE, BLOOD    EKG  EKG Interpretation  Date/Time:  Tuesday July 13 2017 05:11:08 EST Ventricular Rate:  83 PR Interval:    QRS Duration: 87 QT Interval:  388 QTC Calculation: 456 R Axis:   44 Text Interpretation:  Sinus rhythm Low voltage, precordial leads No previous ECGs available Confirmed by Ezequiel Essex [phone]) on 07/13/2017 5:31:21 AM       Radiology No results found.  Procedures Procedures (including critical care time)  Medications Ordered in ED Medications  ondansetron (ZOFRAN-ODT) disintegrating tablet 4 mg (4 mg Oral Given 07/13/17 0603)  dicyclomine  (BENTYL) capsule 10 mg (10 mg Oral Given 07/13/17 0603)     Initial Impression / Assessment and Plan / ED Course  I have reviewed the triage vital signs and the nursing notes.  Pertinent labs & imaging results that were available during my care of the patient were reviewed by me and considered in my medical decision making (see chart for details).    Worsening abdominal pain with diarrhea and nausea.  History of IBS.  Vitals are stable.  Abdomen is soft without peritoneal signs.  Labs are reassuring.  LFTs have normalized from previous values.  Lipase negative.  Urinalysis negative.  Patient given p.o. Zofran as well as Bentyl.  Improvement has occurred in the ED.  She is tolerating p.o.  No episodes of vomiting or diarrhea.  Her abdomen is soft without peritoneal signs.  Will discharge with Zofran as well as  Bentyl.  Follow-up with gastroenterology.  Return precautions discussed. Final Clinical Impressions(s) / ED Diagnoses   Final diagnoses:  Abdominal pain, unspecified abdominal location  Diarrhea, unspecified type    ED Discharge Orders    None       Madoc Holquin, Annie Main, MD 07/13/17 [phone]

## 2017-07-29 ENCOUNTER — Encounter: Payer: Self-pay | Admitting: Internal Medicine

## 2017-09-23 ENCOUNTER — Encounter: Payer: Self-pay | Admitting: Nurse Practitioner

## 2017-09-23 ENCOUNTER — Telehealth: Payer: Self-pay | Admitting: Nurse Practitioner

## 2017-09-23 ENCOUNTER — Ambulatory Visit: Payer: Medicaid Other | Admitting: Nurse Practitioner

## 2017-09-23 NOTE — Telephone Encounter (Signed)
PATIENT WAS A NO SHOW AND LETTER SENT  °

## 2017-09-23 NOTE — Telephone Encounter (Signed)
Noted  

## 2018-11-02 ENCOUNTER — Encounter: Payer: Self-pay | Admitting: Internal Medicine

## 2019-01-09 ENCOUNTER — Encounter: Payer: Self-pay | Admitting: Nurse Practitioner

## 2019-01-09 ENCOUNTER — Other Ambulatory Visit: Payer: Self-pay

## 2019-01-09 ENCOUNTER — Ambulatory Visit (INDEPENDENT_AMBULATORY_CARE_PROVIDER_SITE_OTHER): Payer: Medicaid Other | Admitting: Nurse Practitioner

## 2019-01-09 DIAGNOSIS — R109 Unspecified abdominal pain: Secondary | ICD-10-CM

## 2019-01-09 DIAGNOSIS — R197 Diarrhea, unspecified: Secondary | ICD-10-CM | POA: Diagnosis not present

## 2019-01-09 MED ORDER — DICYCLOMINE HCL 10 MG PO CAPS: 10.0000 mg | ORAL_CAPSULE | Freq: Four times a day (QID) | ORAL | 1 refills | Status: DC | PRN

## 2019-01-09 NOTE — Assessment & Plan Note (Signed)
The patient describes chronic diarrhea for several years.  She has not really seen any other providers for this.  She has multiple stools a day.  She has had a cholecystectomy.  She has associated abdominal pain.  She typically needs to take 4 Imodium to be able to go anywhere and subsequently is having an impact on her quality of life.  She has never had a colonoscopy before and is currently age 53.  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  At this point I will start her on Bentyl 10 mg 3 times daily and at bedtime as needed for abdominal discomfort or diarrhea.  She can continue use Imodium as needed.  We will schedule a colonoscopy for her to further evaluate as well as screening for CRC.  Follow-up in 2 months.  Proceed with TCS on propofol/MAC with Dr. Gala Romney in near future: the risks, benefits, and alternatives have been discussed with the patient in detail. The patient states understanding and desires to proceed.  The patient is currently on BuSpar and Lexapro.  No other anticoagulants, anxiolytics, chronic pain medications, or antidepressants.  We will plan for the procedure on propofol/MAC to promote adequate sedation.

## 2019-01-09 NOTE — Progress Notes (Signed)
Referring Provider: Monico Blitz, MD Primary Care Physician:  Monico Blitz, MD Primary GI:  Dr. Gala Romney  NOTE: Service was provided via telemedicine and was requested by the patient due to COVID-19 pandemic.  Method of visit: Doxy.Me  Patient Location: Home  Provider Location: Office  Reason for Phone Visit: PCP Referral  The patient was consented to phone follow-up via telephone encounter including billing of the encounter (yes/no): Yes  Persons present on the phone encounter, with roles: Fiancee  Total time (minutes) spent on medical discussion: 21 minutes  Chief Complaint  Patient presents with   Bloated   Diarrhea    after eating   Rectal Pain    HPI:   Sharon Macdonald is a 53 y.o. female who presents for virtual visit regarding: Primary care for bloating.  Reviewed information provided with referral including office visit dated 10/21/2018.  At that time she stated "my abdomen feels like it is tender and swollen" and notes history of IBS.  Also noted moderate abdominal pain which is generalized, associated with constipation, diarrhea, heartburn.  Per patient request she was referred to GI.  No history of colonoscopy or endoscopy in our system.  Today she states she's doing well overall. She has been bloating and diarrhea "very bad" for several years. Has a bowel movement every time she eats, about 4+ times a day. Hasn't seen anyone for this. Has abdominal pain occasionally in her upper abdomen, but mostly has rectal pain before, during, and after diarrhea. Rectal pain typically improves/resolves about 30 minutes after a bowel movement. Has not used anything to help the rectal pain. Denies GERD symptoms. Denies other abdominal pain. Has occasional/every once in a while nausea with even more rare vomiting. Denies hematemesis. Denies hematochezia, melena, fever, chills, unintentional weight loss. Has been trying to lose weight. Has a hernia "at the top of my stomach" that  flares up and causes discomfort every now and then. Denies URI and flu-like symptoms. Denies any loss of sense of taste/smell. Denies chest pain, dyspnea, dizziness, lightheadedness, syncope, near syncope. Denies any other upper or lower GI symptoms.  Has never has a colonoscopy before. Denies NSAIDs, ASA powders.  The only way she can go out is to take 4 Imodium.  Past Medical History:  Diagnosis Date   Anxiety 07/13/2017   IBS (irritable bowel syndrome)    Restless leg syndrome     Past Surgical History:  Procedure Laterality Date   APPENDECTOMY     CHOLECYSTECTOMY      Current Outpatient Medications  Medication Sig Dispense Refill   albuterol (PROAIR HFA) 108 (90 Base) MCG/ACT inhaler Inhale 1-2 puffs into the lungs every 6 (six) hours as needed for wheezing or shortness of breath.     busPIRone (BUSPAR) 15 MG tablet Take 15 mg by mouth 2 (two) times daily as needed. for anxiety     escitalopram (LEXAPRO) 20 MG tablet Take 20 mg by mouth daily.     loperamide (IMODIUM A-D) 2 MG tablet Take 8 mg by mouth as needed for diarrhea or loose stools.      ondansetron (ZOFRAN) 4 MG tablet Take 1 tablet (4 mg total) by mouth every 8 (eight) hours as needed for nausea or vomiting. (Patient not taking: Reported on 01/09/2019) 12 tablet 0   No current facility-administered medications for this visit.     Allergies as of 01/09/2019 - Review Complete 01/09/2019  Allergen Reaction Noted   Aspirin Other (See Comments) 04/13/2014  Family History  Problem Relation Age of Onset   Cancer Mother    Colon cancer Neg Hx     Social History   Socioeconomic History   Marital status: Single    Spouse name: Not on file   Number of children: Not on file   Years of education: Not on file   Highest education level: Not on file  Occupational History   Not on file  Social Needs   Financial resource strain: Not on file   Food insecurity:    Worry: Not on file    Inability:  Not on file   Transportation needs:    Medical: Not on file    Non-medical: Not on file  Tobacco Use   Smoking status: Current Every Day Smoker    Types: E-cigarettes   Smokeless tobacco: Never Used  Substance and Sexual Activity   Alcohol use: No   Drug use: No   Sexual activity: Yes    Birth control/protection: Surgical  Lifestyle   Physical activity:    Days per week: Not on file    Minutes per session: Not on file   Stress: Not on file  Relationships   Social connections:    Talks on phone: Not on file    Gets together: Not on file    Attends religious service: Not on file    Active member of club or organization: Not on file    Attends meetings of clubs or organizations: Not on file    Relationship status: Not on file  Other Topics Concern   Not on file  Social History Narrative   Not on file    Review of Systems: General: Negative for anorexia, weight loss, fever, chills, fatigue, weakness. ENT: Negative for hoarseness, difficulty swallowing. CV: Negative for chest pain, angina, palpitations, peripheral edema.  Respiratory: Negative for dyspnea at rest, cough, sputum, wheezing.  GI: See history of present illness. MS: Negative for joint pain, low back pain.  Derm: Negative for rash or itching.  Endo: Negative for unusual weight change.  Heme: Negative for bruising or bleeding. Allergy: Negative for rash or hives.  Physical Exam: Note: limited exam due to virtual visit General:   Alert and oriented. Pleasant and cooperative. Well-nourished and well-developed.  Head:  Normocephalic and atraumatic. Eyes:  Without icterus, sclera clear and conjunctiva pink.  Ears:  Normal auditory acuity. Skin:  Intact without facial significant lesions or rashes. Neurologic:  Alert and oriented x4;  grossly normal neurologically. Psych:  Alert and cooperative. Normal mood and affect. Heme/Lymph/Immune: No excessive bruising noted.

## 2019-01-09 NOTE — Patient Instructions (Signed)
Your health issues we discussed today were:   Abdominal pain and diarrhea: 1. It sounds like your diarrhea has been going on for a number of years. 2. Because of the time you have had your diarrhea, I seriously doubt a significant infection as you would likely have gotten very sick before now 3. You have a history of IBS and had your gallbladder out.  Both of these can result in diarrhea 4. I have sent a prescription to your pharmacy for Bentyl 10 mg.  You can take this up to 4 times a day as needed for abdominal pain and/or diarrhea 5. He can continue to use Imodium to help with the diarrhea as well 6. We will schedule a colonoscopy for you 7. Further recommendations will be made after your colonoscopy 8. Call us for any severe or worsening symptoms  Overall I recommend:  1. Continue your other current medications 2. Call us if you have any questions or concerns 3. Otherwise, follow-up in 2 months   Because of recent events of COVID-19 ("Coronavirus"), follow CDC recommendations:  1. Wash your hand frequently 2. Avoid touching your face 3. Stay away from people who are sick 4. If you have symptoms such as fever, cough, shortness of breath then call your healthcare provider for further guidance 5. If you are sick, STAY AT HOME unless otherwise directed by your healthcare provider. 6. Follow directions from state and national officials regarding staying safe    At [facility] we value your feedback. You may receive a survey about your visit today. Please share your experience as we strive to create trusting relationships with our patients to provide genuine, compassionate, quality care.  We appreciate your understanding and patience as we review any laboratory studies, imaging, and other diagnostic tests that are ordered as we care for you. Our office policy is 5 business days for review of these results, and any emergent or urgent results are addressed in a timely  manner for your best interest. If you do not hear from our office in 1 week, please contact us.   We also encourage the use of MyChart, which contains your medical information for your review as well. If you are not enrolled in this feature, an access code is on this after visit summary for your convenience. Thank you for allowing [place] to be involved in your care.  It was great to see you today!  I hope you have a great day!!

## 2019-01-09 NOTE — Assessment & Plan Note (Signed)
She has intermittent, somewhat rare, abdominal pain.  It does not seem to relieved with a bowel movement.  She does note a history of IBS.  There is also some possible overlay with bowel side diarrhea as per below.  At this point I will start her on Bentyl which can help with her abdominal discomfort as well as her diarrhea.  Further evaluation with a colonoscopy, which she has never had before (see below).  Follow-up in 2 months.  Call for any worsening or severe symptoms.

## 2019-01-10 ENCOUNTER — Encounter: Payer: Self-pay | Admitting: Internal Medicine

## 2019-01-10 ENCOUNTER — Telehealth: Payer: Self-pay | Admitting: *Deleted

## 2019-01-10 ENCOUNTER — Other Ambulatory Visit: Payer: Self-pay | Admitting: *Deleted

## 2019-01-10 DIAGNOSIS — R197 Diarrhea, unspecified: Secondary | ICD-10-CM

## 2019-01-10 DIAGNOSIS — R109 Unspecified abdominal pain: Secondary | ICD-10-CM

## 2019-01-10 MED ORDER — CLENPIQ 10-3.5-12 MG-GM -GM/160ML PO SOLN: 1.0000 | Freq: Once | ORAL | 0 refills | Status: AC

## 2019-01-10 NOTE — Telephone Encounter (Signed)
Called patient procedure scheduled for TCS w/ MAC w/ propofol for 03/02/2019 at 1:45pm. Patient aware will mail instructions with pre-op appt (confirmed address). Rx sent to pharmacy. Orders entered.

## 2019-01-10 NOTE — Progress Notes (Signed)
CC'ED TO PCP 

## 2019-01-10 NOTE — Telephone Encounter (Signed)
Pre-op mailed with instructions.

## 2019-02-23 ENCOUNTER — Inpatient Hospital Stay (HOSPITAL_COMMUNITY): Admission: RE | Admit: 2019-02-23 | Payer: Medicaid Other | Source: Ambulatory Visit

## 2019-03-01 ENCOUNTER — Telehealth: Payer: Self-pay

## 2019-03-01 NOTE — Telephone Encounter (Signed)
Endo scheduler said pt canceled TCS w/Propofol w/RMR that was for 03/02/19. She had told pt to call office to reschedule.  Called pt, TCS rescheduled to 05/08/19 at 2:30pm. Informed endo scheduler. Pre-op appt 05/04/19 at 2:45pm. Appt letter mailed with new procedure instructions.

## 2019-03-23 ENCOUNTER — Ambulatory Visit: Payer: Medicaid Other | Admitting: Nurse Practitioner

## 2019-03-23 NOTE — Progress Notes (Deleted)
Referring Provider: Monico Blitz, MD Primary Care Physician:  Monico Blitz, MD Primary GI:  Dr. Gala Romney  No chief complaint on file.   HPI:   Sharon Macdonald is a 53 y.o. female who presents for abdominal pain and diarrhea.  Patient was last seen in our office dated 03/13/2015.  Her last visit was a virtual office visit related to COVID-19/coronavirus pandemic.  At that time she has been referred by primary care for bloating.  Noted history of IBS.  No history of colonoscopy or endoscopy in our system.  At her visit she noted bloating and diarrhea "very bad" for several years with a bowel movement every time she eats proximately 4 times a day.  Abdominal pain occasionally in the upper abdomen but mostly has rectal pain before, during, and after diarrhea typically resolves after 30 minutes post bowel movement.  Has not tried any treatments.  Denies GERD symptoms.  "Every once in a while" she has nausea but very rare vomiting.  Has a hernia "at the top of my stomach "that flares and causes discomfort every now and then.  No other GI complaints.  The only way she can go out in public is to "take 4 Imodium".  History of cholecystectomy.  Recommended trial of Bentyl 10 mg 4 times a day as needed for abdominal pain diarrhea, continue Imodium as needed, colonoscopy, follow-up in 2 months.  Her colonoscopy is currently scheduled for 05/08/2019.  Today she states   Past Medical History:  Diagnosis Date  . Anxiety 07/13/2017  . IBS (irritable bowel syndrome)   . Restless leg syndrome     Past Surgical History:  Procedure Laterality Date  . APPENDECTOMY    . CHOLECYSTECTOMY      Current Outpatient Medications  Medication Sig Dispense Refill  . albuterol (PROAIR HFA) 108 (90 Base) MCG/ACT inhaler Inhale 1-2 puffs into the lungs every 6 (six) hours as needed for wheezing or shortness of breath.    . busPIRone (BUSPAR) 15 MG tablet Take 15 mg by mouth 2 (two) times daily as needed (anxiety).     Marland Kitchen  dicyclomine (BENTYL) 10 MG capsule Take 1 capsule (10 mg total) by mouth 4 (four) times daily as needed (for abdominal pain or diarrhea). (Patient not taking: Reported on 02/17/2019) 120 capsule 1  . ondansetron (ZOFRAN) 4 MG tablet Take 1 tablet (4 mg total) by mouth every 8 (eight) hours as needed for nausea or vomiting. (Patient not taking: Reported on 01/09/2019) 12 tablet 0  . pramipexole (MIRAPEX) 0.75 MG tablet Take 0.75 mg by mouth at bedtime.     No current facility-administered medications for this visit.     Allergies as of 03/23/2019  . (No Known Allergies)    Family History  Problem Relation Age of Onset  . Cancer Mother   . Colon cancer Neg Hx     Social History   Socioeconomic History  . Marital status: Single    Spouse name: Not on file  . Number of children: Not on file  . Years of education: Not on file  . Highest education level: Not on file  Occupational History  . Not on file  Social Needs  . Financial resource strain: Not on file  . Food insecurity    Worry: Not on file    Inability: Not on file  . Transportation needs    Medical: Not on file    Non-medical: Not on file  Tobacco Use  . Smoking status:  Current Every Day Smoker    Types: E-cigarettes  . Smokeless tobacco: Never Used  Substance and Sexual Activity  . Alcohol use: No  . Drug use: No  . Sexual activity: Yes    Birth control/protection: Surgical  Lifestyle  . Physical activity    Days per week: Not on file    Minutes per session: Not on file  . Stress: Not on file  Relationships  . Social Herbalist on phone: Not on file    Gets together: Not on file    Attends religious service: Not on file    Active member of club or organization: Not on file    Attends meetings of clubs or organizations: Not on file    Relationship status: Not on file  Other Topics Concern  . Not on file  Social History Narrative  . Not on file    Review of Systems: General: Negative for  anorexia, weight loss, fever, chills, fatigue, weakness. Eyes: Negative for vision changes.  ENT: Negative for hoarseness, difficulty swallowing , nasal congestion. CV: Negative for chest pain, angina, palpitations, dyspnea on exertion, peripheral edema.  Respiratory: Negative for dyspnea at rest, dyspnea on exertion, cough, sputum, wheezing.  GI: See history of present illness. GU:  Negative for dysuria, hematuria, urinary incontinence, urinary frequency, nocturnal urination.  MS: Negative for joint pain, low back pain.  Derm: Negative for rash or itching.  Neuro: Negative for weakness, abnormal sensation, seizure, frequent headaches, memory loss, confusion.  Psych: Negative for anxiety, depression, suicidal ideation, hallucinations.  Endo: Negative for unusual weight change.  Heme: Negative for bruising or bleeding. Allergy: Negative for rash or hives.   Physical Exam: LMP 08/30/2015 (Approximate)  General:   Alert and oriented. Pleasant and cooperative. Well-nourished and well-developed.  Head:  Normocephalic and atraumatic. Eyes:  Without icterus, sclera clear and conjunctiva pink.  Ears:  Normal auditory acuity. Mouth:  No deformity or lesions, oral mucosa pink.  Throat/Neck:  Supple, without mass or thyromegaly. Cardiovascular:  S1, S2 present without murmurs appreciated. Normal pulses noted. Extremities without clubbing or edema. Respiratory:  Clear to auscultation bilaterally. No wheezes, rales, or rhonchi. No distress.  Gastrointestinal:  +BS, soft, non-tender and non-distended. No HSM noted. No guarding or rebound. No masses appreciated.  Rectal:  Deferred  Musculoskalatal:  Symmetrical without gross deformities. Normal posture. Skin:  Intact without significant lesions or rashes. Neurologic:  Alert and oriented x4;  grossly normal neurologically. Psych:  Alert and cooperative. Normal mood and affect. Heme/Lymph/Immune: No significant cervical adenopathy. No excessive  bruising noted.    03/23/2019 7:57 AM   Disclaimer: This note was dictated with voice recognition software. Similar sounding words can inadvertently be transcribed and may not be corrected upon review.

## 2019-05-02 ENCOUNTER — Encounter: Payer: Self-pay | Admitting: Nurse Practitioner

## 2019-05-02 ENCOUNTER — Other Ambulatory Visit: Payer: Self-pay

## 2019-05-02 ENCOUNTER — Ambulatory Visit: Payer: Medicaid Other | Admitting: Nurse Practitioner

## 2019-05-02 VITALS — BP 122/73 | HR 64 | Temp 95.7°F | Ht 68.0 in | Wt 265.0 lb

## 2019-05-02 DIAGNOSIS — R197 Diarrhea, unspecified: Secondary | ICD-10-CM | POA: Diagnosis not present

## 2019-05-02 DIAGNOSIS — K6289 Other specified diseases of anus and rectum: Secondary | ICD-10-CM | POA: Diagnosis not present

## 2019-05-02 DIAGNOSIS — R109 Unspecified abdominal pain: Secondary | ICD-10-CM

## 2019-05-02 NOTE — Patient Instructions (Signed)
Your health issues we discussed today were:   Diarrhea with abdominal pain, rectal discomfort: 1. I have put in orders for stool test 2. Collect your stool samples and bring them to the lab as soon as you can 3. We will make further recommendations based on those results 4. Have your colonoscopy completed 05/08/2019 5. For the rectal discomfort after frequent bowel movements you can use Tucks medicated wipes or Preparation H.  If this is not effective call us and we can send in a medicated cream  Overall I recommend:  1. Continue your other current medications 2. Return for follow-up in 2 months 3. Call us if you have any questions or concerns.   Because of recent events of COVID-19 ("Coronavirus"), follow CDC recommendations:  1. Wash your hand frequently 2. Avoid touching your face 3. Stay away from people who are sick 4. If you have symptoms such as fever, cough, shortness of breath then call your healthcare provider for further guidance 5. If you are sick, STAY AT HOME unless otherwise directed by your healthcare provider. 6. Follow directions from state and national officials regarding staying safe   At [facility] we value your feedback. You may receive a survey about your visit today. Please share your experience as we strive to create trusting relationships with our patients to provide genuine, compassionate, quality care.  We appreciate your understanding and patience as we review any laboratory studies, imaging, and other diagnostic tests that are ordered as we care for you. Our office policy is 5 business days for review of these results, and any emergent or urgent results are addressed in a timely manner for your best interest. If you do not hear from our office in 1 week, please contact us.   We also encourage the use of MyChart, which contains your medical information for your review as well. If you are not enrolled in this feature, an access code is on this  after visit summary for your convenience. Thank you for allowing [place] to be involved in your care.  It was great to see you today!  I hope you have a great Fall!!

## 2019-05-02 NOTE — Assessment & Plan Note (Signed)
She notes lower abdominal pain prior to her stools.  After a bowel movement she typically has an improvement in her pain.  This is what made me initially think she likely has irritable bowel syndrome.  Unfortunately she is status post cholecystectomy and is not a candidate for Viberzi.  Other treatment options besides Bentyl could include hyoscyamine, Levsin, Xifaxan.  Further recommendations to follow.  Follow-up in 2 months.

## 2019-05-02 NOTE — Progress Notes (Signed)
Referring Provider: Monico Blitz, MD Primary Care Physician:  Monico Blitz, MD Primary GI:  Dr. Gala Romney  Chief Complaint  Patient presents with   Diarrhea   Abdominal Pain    lower mid abd; unable to lay on left side   Rectal Pain    HPI:   Sharon Macdonald is a 53 y.o. female who presents for follow-up on abdominal pain and diarrhea.  The patient was last seen in our office 01/09/2019 for the same.  The patient's last office visit was a virtual visit due to COVID-19/coronavirus pandemic.  Noted history of IBS.  Generalized abdominal pain associated with constipation, diarrhea, heartburn.  At her last visit she noted bloating and diarrhea "very bad" for several years with postprandial bowel movement 4 times a day.  Occasional abdominal pain in the upper abdomen but mostly rectal pain before, during, and after diarrhea and typically resolves after a bowel movement.  Denies GERD symptoms.  Denies any other abdominal pain.  Occasional nausea and very rare vomiting.  Has a hiatal hernia, trying to lose weight.  Never had a colonoscopy.  Recommended Bentyl 10 mg up to 4 times a day, Imodium as needed, colonoscopy, follow-up in 2 months.  Colonoscopy is scheduled for this week.  Today she states she's doing ok overall. Bentyl hasn't helped her abdominal pain and diarrhea. She has been taking Bentyl 4 times a day. Initially worked but then stopped. Currently having 12+ stools a day. 99.9% of the time is watery diarrhea, rarely with "pieces in it." Used to take Imodium, which ehlped her diarrhea but made her sick to her stomach. Abdominal pain is lower abdomen, cannot lay on left side; when she has a bowel movement her abdominal pain improves; has rectal discomfort with frequent stools. No identified food triggers, has tried multiple options (including increased fiber). S/P cholecystectomy. Denies hematochezia, fever, chills, unintentional weight loss. Having rare, mild "problems with my hernia  (hiatal)" including GERD-like symptoms. Symptoms are mild. Avoids known triggers. Much better than it has been. Denies URI or flu-like symptoms. Denies loss of sense of taste or smell. Denies chest pain, dyspnea, dizziness, lightheadedness, syncope, near syncope. Denies any other upper or lower GI symptoms.  Past Medical History:  Diagnosis Date   Anxiety 07/13/2017   IBS (irritable bowel syndrome)    Restless leg syndrome     Past Surgical History:  Procedure Laterality Date   APPENDECTOMY     CHOLECYSTECTOMY      Current Outpatient Medications  Medication Sig Dispense Refill   albuterol (PROAIR HFA) 108 (90 Base) MCG/ACT inhaler Inhale 1-2 puffs into the lungs every 6 (six) hours as needed for wheezing or shortness of breath.     busPIRone (BUSPAR) 15 MG tablet Take 15 mg by mouth 2 (two) times daily as needed (anxiety).      dicyclomine (BENTYL) 10 MG capsule Take 1 capsule (10 mg total) by mouth 4 (four) times daily as needed (for abdominal pain or diarrhea). 120 capsule 1   escitalopram (LEXAPRO) 20 MG tablet Take 20 mg by mouth daily.     ondansetron (ZOFRAN) 4 MG tablet Take 1 tablet (4 mg total) by mouth every 8 (eight) hours as needed for nausea or vomiting. 12 tablet 0   No current facility-administered medications for this visit.     Allergies as of 05/02/2019   (No Known Allergies)    Family History  Problem Relation Age of Onset   Cancer Mother    Colon  cancer Neg Hx     Social History   Socioeconomic History   Marital status: Single    Spouse name: Not on file   Number of children: Not on file   Years of education: Not on file   Highest education level: Not on file  Occupational History   Not on file  Social Needs   Financial resource strain: Not on file   Food insecurity    Worry: Not on file    Inability: Not on file   Transportation needs    Medical: Not on file    Non-medical: Not on file  Tobacco Use   Smoking status:  Current Every Day Smoker    Types: E-cigarettes   Smokeless tobacco: Never Used  Substance and Sexual Activity   Alcohol use: No   Drug use: No   Sexual activity: Yes    Birth control/protection: Surgical  Lifestyle   Physical activity    Days per week: Not on file    Minutes per session: Not on file   Stress: Not on file  Relationships   Social connections    Talks on phone: Not on file    Gets together: Not on file    Attends religious service: Not on file    Active member of club or organization: Not on file    Attends meetings of clubs or organizations: Not on file    Relationship status: Not on file  Other Topics Concern   Not on file  Social History Narrative   Not on file    Review of Systems: General: Negative for anorexia, weight loss, fever, chills, fatigue, weakness. Eyes: Negative for vision changes.  ENT: Negative for hoarseness, difficulty swallowing , nasal congestion. CV: Negative for chest pain, angina, palpitations, dyspnea on exertion, peripheral edema.  Respiratory: Negative for dyspnea at rest, dyspnea on exertion, cough, sputum, wheezing.  GI: See history of present illness. GU:  Negative for dysuria, hematuria, urinary incontinence, urinary frequency, nocturnal urination.  MS: Negative for joint pain, low back pain.  Derm: Negative for rash or itching.  Neuro: Negative for weakness, abnormal sensation, seizure, frequent headaches, memory loss, confusion.  Psych: Negative for anxiety, depression, suicidal ideation, hallucinations.  Endo: Negative for unusual weight change.  Heme: Negative for bruising or bleeding. Allergy: Negative for rash or hives.   Physical Exam: BP 122/73    Pulse 64    Temp (!) 95.7 F (35.4 C) (Temporal)    Ht 5\' 8"  (1.727 m)    Wt 265 lb (120.2 kg)    LMP 08/30/2015 (Approximate)    BMI 40.29 kg/m  General:   Alert and oriented. Pleasant and cooperative. Well-nourished and well-developed.  Head:  Normocephalic  and atraumatic. Eyes:  Without icterus, sclera clear and conjunctiva pink.  Ears:  Normal auditory acuity. Mouth:  No deformity or lesions, oral mucosa pink.  Throat/Neck:  Supple, without mass or thyromegaly. Cardiovascular:  S1, S2 present without murmurs appreciated. Normal pulses noted. Extremities without clubbing or edema. Respiratory:  Clear to auscultation bilaterally. No wheezes, rales, or rhonchi. No distress.  Gastrointestinal:  +BS, soft, non-tender and non-distended. No HSM noted. No guarding or rebound. No masses appreciated.  Rectal:  Deferred  Musculoskalatal:  Symmetrical without gross deformities. Normal posture. Skin:  Intact without significant lesions or rashes. Neurologic:  Alert and oriented x4;  grossly normal neurologically. Psych:  Alert and cooperative. Normal mood and affect. Heme/Lymph/Immune: No significant cervical adenopathy. No excessive bruising noted.    05/02/2019  10:02 AM   Disclaimer: This note was dictated with voice recognition software. Similar sounding words can inadvertently be transcribed and may not be corrected upon review.

## 2019-05-02 NOTE — H&P (View-Only) (Signed)
Referring Provider: Monico Blitz, MD Primary Care Physician:  Monico Blitz, MD Primary GI:  Dr. Gala Romney  Chief Complaint  Patient presents with   Diarrhea   Abdominal Pain    lower mid abd; unable to lay on left side   Rectal Pain    HPI:   Sharon Macdonald is a 53 y.o. Sharon who presents for follow-up on abdominal pain and diarrhea.  The patient was last seen in our office 01/09/2019 for the same.  The patient's last office visit was a virtual visit due to COVID-19/coronavirus pandemic.  Noted history of IBS.  Generalized abdominal pain associated with constipation, diarrhea, heartburn.  At her last visit she noted bloating and diarrhea "very bad" for several years with postprandial bowel movement 4 times a day.  Occasional abdominal pain in the upper abdomen but mostly rectal pain before, during, and after diarrhea and typically resolves after a bowel movement.  Denies GERD symptoms.  Denies any other abdominal pain.  Occasional nausea and very rare vomiting.  Has a hiatal hernia, trying to lose weight.  Never had a colonoscopy.  Recommended Bentyl 10 mg up to 4 times a day, Imodium as needed, colonoscopy, follow-up in 2 months.  Colonoscopy is scheduled for this week.  Today she states she's doing ok overall. Bentyl hasn't helped her abdominal pain and diarrhea. She has been taking Bentyl 4 times a day. Initially worked but then stopped. Currently having 12+ stools a day. 99.9% of the time is watery diarrhea, rarely with "pieces in it." Used to take Imodium, which ehlped her diarrhea but made her sick to her stomach. Abdominal pain is lower abdomen, cannot lay on left side; when she has a bowel movement her abdominal pain improves; has rectal discomfort with frequent stools. No identified food triggers, has tried multiple options (including increased fiber). S/P cholecystectomy. Denies hematochezia, fever, chills, unintentional weight loss. Having rare, mild "problems with my hernia  (hiatal)" including GERD-like symptoms. Symptoms are mild. Avoids known triggers. Much better than it has been. Denies URI or flu-like symptoms. Denies loss of sense of taste or smell. Denies chest pain, dyspnea, dizziness, lightheadedness, syncope, near syncope. Denies any other upper or lower GI symptoms.  Past Medical History:  Diagnosis Date   Anxiety 07/13/2017   IBS (irritable bowel syndrome)    Restless leg syndrome     Past Surgical History:  Procedure Laterality Date   APPENDECTOMY     CHOLECYSTECTOMY      Current Outpatient Medications  Medication Sig Dispense Refill   albuterol (PROAIR HFA) 108 (90 Base) MCG/ACT inhaler Inhale 1-2 puffs into the lungs every 6 (six) hours as needed for wheezing or shortness of breath.     busPIRone (BUSPAR) 15 MG tablet Take 15 mg by mouth 2 (two) times daily as needed (anxiety).      dicyclomine (BENTYL) 10 MG capsule Take 1 capsule (10 mg total) by mouth 4 (four) times daily as needed (for abdominal pain or diarrhea). 120 capsule 1   escitalopram (LEXAPRO) 20 MG tablet Take 20 mg by mouth daily.     ondansetron (ZOFRAN) 4 MG tablet Take 1 tablet (4 mg total) by mouth every 8 (eight) hours as needed for nausea or vomiting. 12 tablet 0   No current facility-administered medications for this visit.     Allergies as of 05/02/2019   (No Known Allergies)    Family History  Problem Relation Age of Onset   Cancer Mother    Colon  cancer Neg Hx     Social History   Socioeconomic History   Marital status: Single    Spouse name: Not on file   Number of children: Not on file   Years of education: Not on file   Highest education level: Not on file  Occupational History   Not on file  Social Needs   Financial resource strain: Not on file   Food insecurity    Worry: Not on file    Inability: Not on file   Transportation needs    Medical: Not on file    Non-medical: Not on file  Tobacco Use   Smoking status:  Current Every Day Smoker    Types: E-cigarettes   Smokeless tobacco: Never Used  Substance and Sexual Activity   Alcohol use: No   Drug use: No   Sexual activity: Yes    Birth control/protection: Surgical  Lifestyle   Physical activity    Days per week: Not on file    Minutes per session: Not on file   Stress: Not on file  Relationships   Social connections    Talks on phone: Not on file    Gets together: Not on file    Attends religious service: Not on file    Active member of club or organization: Not on file    Attends meetings of clubs or organizations: Not on file    Relationship status: Not on file  Other Topics Concern   Not on file  Social History Narrative   Not on file    Review of Systems: General: Negative for anorexia, weight loss, fever, chills, fatigue, weakness. Eyes: Negative for vision changes.  ENT: Negative for hoarseness, difficulty swallowing , nasal congestion. CV: Negative for chest pain, angina, palpitations, dyspnea on exertion, peripheral edema.  Respiratory: Negative for dyspnea at rest, dyspnea on exertion, cough, sputum, wheezing.  GI: See history of present illness. GU:  Negative for dysuria, hematuria, urinary incontinence, urinary frequency, nocturnal urination.  MS: Negative for joint pain, low back pain.  Derm: Negative for rash or itching.  Neuro: Negative for weakness, abnormal sensation, seizure, frequent headaches, memory loss, confusion.  Psych: Negative for anxiety, depression, suicidal ideation, hallucinations.  Endo: Negative for unusual weight change.  Heme: Negative for bruising or bleeding. Allergy: Negative for rash or hives.   Physical Exam: BP 122/73    Pulse 64    Temp (!) 95.7 F (35.4 C) (Temporal)    Ht 5\' 8"  (1.727 m)    Wt 265 lb (120.2 kg)    LMP 08/30/2015 (Approximate)    BMI 40.29 kg/m  General:   Alert and oriented. Pleasant and cooperative. Well-nourished and well-developed.  Head:  Normocephalic  and atraumatic. Eyes:  Without icterus, sclera clear and conjunctiva pink.  Ears:  Normal auditory acuity. Mouth:  No deformity or lesions, oral mucosa pink.  Throat/Neck:  Supple, without mass or thyromegaly. Cardiovascular:  S1, S2 present without murmurs appreciated. Normal pulses noted. Extremities without clubbing or edema. Respiratory:  Clear to auscultation bilaterally. No wheezes, rales, or rhonchi. No distress.  Gastrointestinal:  +BS, soft, non-tender and non-distended. No HSM noted. No guarding or rebound. No masses appreciated.  Rectal:  Deferred  Musculoskalatal:  Symmetrical without gross deformities. Normal posture. Skin:  Intact without significant lesions or rashes. Neurologic:  Alert and oriented x4;  grossly normal neurologically. Psych:  Alert and cooperative. Normal mood and affect. Heme/Lymph/Immune: No significant cervical adenopathy. No excessive bruising noted.    05/02/2019  10:02 AM   Disclaimer: This note was dictated with voice recognition software. Similar sounding words can inadvertently be transcribed and may not be corrected upon review.

## 2019-05-02 NOTE — Assessment & Plan Note (Signed)
Bentyl initially helped her diarrhea but then stopped.  She is currently having 10-12 loose/watery stools a day.  At this point given the number of stools and consistency I will check stool studies for C. difficile and GI pathogen panel.  Depending on results we could consider Levsin versus hyoscyamine.  She does have a colonoscopy upcoming and it will be interesting to see if she has eosinophilic colitis.  We will call with results and further recommendations.  Follow-up in 2 months.

## 2019-05-02 NOTE — Assessment & Plan Note (Signed)
Rectal pain likely due to frequent stools during the day.  I will recommend she use medicated wipes or over-the-counter Preparation H.  If this is ineffective she can call us and we can send in Anusol rectal cream.  She does have a colonoscopy upcoming.  Further medications and management based on results of colonoscopy and stool studies.  Follow-up with him in 2 months.

## 2019-05-04 ENCOUNTER — Encounter (HOSPITAL_COMMUNITY)
Admission: RE | Admit: 2019-05-04 | Discharge: 2019-05-04 | Disposition: A | Discharge status: Home / Self Care | Payer: Medicaid Other | Source: Ambulatory Visit | Attending: Internal Medicine | Admitting: Internal Medicine

## 2019-05-04 ENCOUNTER — Other Ambulatory Visit: Payer: Self-pay

## 2019-05-05 ENCOUNTER — Other Ambulatory Visit: Payer: Self-pay

## 2019-05-05 ENCOUNTER — Telehealth: Payer: Self-pay | Admitting: *Deleted

## 2019-05-05 ENCOUNTER — Other Ambulatory Visit (HOSPITAL_COMMUNITY)
Admission: RE | Admit: 2019-05-05 | Discharge: 2019-05-05 | Disposition: A | Discharge status: Home / Self Care | Payer: Medicaid Other | Source: Ambulatory Visit | Attending: Internal Medicine | Admitting: Internal Medicine

## 2019-05-05 DIAGNOSIS — Z01812 Encounter for preprocedural laboratory examination: Secondary | ICD-10-CM | POA: Diagnosis not present

## 2019-05-05 DIAGNOSIS — Z20828 Contact with and (suspected) exposure to other viral communicable diseases: Secondary | ICD-10-CM | POA: Insufficient documentation

## 2019-05-05 LAB — SARS CORONAVIRUS 2 (TAT 6-24 HRS): SARS Coronavirus 2: NEGATIVE

## 2019-05-05 MED ORDER — CLENPIQ 10-3.5-12 MG-GM -GM/160ML PO SOLN: 1.0000 | Freq: Once | ORAL | 0 refills | Status: AC

## 2019-05-05 NOTE — Telephone Encounter (Signed)
Called patient back and made aware she needs to arrive at 8:15am to register. She voiced understanding.

## 2019-05-05 NOTE — Telephone Encounter (Signed)
Endo called wanting patient to move procedure time up on MOnday as we had cancellations. Called patient. She has moved procedure time to 10:15am, aware to arrive at 8:45am. Patient never picked up prep and is asking for new Rx and instructions sent to [facility]. Rx sent and I have faxed updated prep instructions. I discussed in detail instructions with patient. Called endo and LMOVM to move patient up.

## 2019-05-08 ENCOUNTER — Encounter (HOSPITAL_COMMUNITY): Payer: Self-pay | Admitting: Anesthesiology

## 2019-05-08 ENCOUNTER — Ambulatory Visit (HOSPITAL_COMMUNITY): Payer: Medicaid Other | Admitting: Anesthesiology

## 2019-05-08 ENCOUNTER — Encounter (HOSPITAL_COMMUNITY)
Admission: RE | Disposition: A | Discharge status: Home / Self Care | Payer: Self-pay | Source: Home / Self Care | Attending: Internal Medicine

## 2019-05-08 ENCOUNTER — Ambulatory Visit (HOSPITAL_COMMUNITY)
Admission: RE | Admit: 2019-05-08 | Discharge: 2019-05-08 | Disposition: A | Discharge status: Home / Self Care | Payer: Medicaid Other | Attending: Internal Medicine | Admitting: Internal Medicine

## 2019-05-08 DIAGNOSIS — K529 Noninfective gastroenteritis and colitis, unspecified: Secondary | ICD-10-CM

## 2019-05-08 DIAGNOSIS — F17290 Nicotine dependence, other tobacco product, uncomplicated: Secondary | ICD-10-CM | POA: Insufficient documentation

## 2019-05-08 DIAGNOSIS — Z79899 Other long term (current) drug therapy: Secondary | ICD-10-CM | POA: Insufficient documentation

## 2019-05-08 DIAGNOSIS — K640 First degree hemorrhoids: Secondary | ICD-10-CM | POA: Insufficient documentation

## 2019-05-08 DIAGNOSIS — F419 Anxiety disorder, unspecified: Secondary | ICD-10-CM | POA: Diagnosis not present

## 2019-05-08 DIAGNOSIS — Z6841 Body Mass Index (BMI) 40.0 and over, adult: Secondary | ICD-10-CM | POA: Insufficient documentation

## 2019-05-08 HISTORY — PX: COLONOSCOPY WITH PROPOFOL: SHX5780

## 2019-05-08 HISTORY — PX: BIOPSY: SHX5522

## 2019-05-08 LAB — C DIFFICILE QUICK SCREEN W PCR REFLEX
C Diff antigen: NEGATIVE
C Diff interpretation: NOT DETECTED
C Diff toxin: NEGATIVE

## 2019-05-08 SURGERY — COLONOSCOPY WITH PROPOFOL
Anesthesia: General

## 2019-05-08 MED ORDER — KETAMINE HCL 10 MG/ML IJ SOLN
INTRAMUSCULAR | Status: DC | PRN
  Administered 2019-05-08: 20 mg via INTRAVENOUS

## 2019-05-08 MED ORDER — LACTATED RINGERS IV SOLN
Freq: Once | INTRAVENOUS | Status: AC
  Administered 2019-05-08: 09:00:00 via INTRAVENOUS

## 2019-05-08 MED ORDER — LACTATED RINGERS IV SOLN
INTRAVENOUS | Status: DC | PRN
  Administered 2019-05-08: 09:00:00 via INTRAVENOUS

## 2019-05-08 MED ORDER — LIDOCAINE HCL (CARDIAC) PF 100 MG/5ML IV SOSY
PREFILLED_SYRINGE | INTRAVENOUS | Status: DC | PRN
  Administered 2019-05-08: 40 mg via INTRAVENOUS

## 2019-05-08 MED ORDER — GLYCOPYRROLATE 0.2 MG/ML IJ SOLN
INTRAMUSCULAR | Status: DC | PRN
  Administered 2019-05-08: 0.2 mg via INTRAVENOUS

## 2019-05-08 MED ORDER — ONDANSETRON HCL 4 MG/2ML IJ SOLN: 4.0000 mg | Freq: Once | INTRAMUSCULAR | Status: DC | PRN

## 2019-05-08 MED ORDER — PROPOFOL 500 MG/50ML IV EMUL
INTRAVENOUS | Status: DC | PRN
  Administered 2019-05-08: 10:00:00 via INTRAVENOUS
  Administered 2019-05-08: 150 ug/kg/min via INTRAVENOUS
  Administered 2019-05-08: 125 ug/kg/min via INTRAVENOUS

## 2019-05-08 MED ORDER — EPHEDRINE SULFATE 50 MG/ML IJ SOLN
INTRAMUSCULAR | Status: DC | PRN
  Administered 2019-05-08: 15 mg via INTRAVENOUS
  Administered 2019-05-08: 10 mg via INTRAVENOUS

## 2019-05-08 MED ORDER — KETAMINE HCL 50 MG/5ML IJ SOSY
PREFILLED_SYRINGE | INTRAMUSCULAR | Status: AC
  Filled 2019-05-08: qty 5

## 2019-05-08 NOTE — Anesthesia Procedure Notes (Signed)
Procedure Name: General with mask airway Date/Time: 05/08/2019 9:33 AM Performed by: Andree Elk, Amy A, CRNA Pre-anesthesia Checklist: Timeout performed, Patient being monitored, Suction available, Emergency Drugs available and Patient identified Oxygen Delivery Method: Non-rebreather mask

## 2019-05-08 NOTE — Anesthesia Postprocedure Evaluation (Signed)
Anesthesia Post Note  Patient: Sharon Macdonald  Procedure(s) Performed: COLONOSCOPY WITH PROPOFOL (N/A ) BIOPSY  Patient location during evaluation: PACU Anesthesia Type: General Level of consciousness: awake and alert, patient cooperative and oriented Pain management: pain level controlled Vital Signs Assessment: post-procedure vital signs reviewed and stable Respiratory status: spontaneous breathing Postop Assessment: no apparent nausea or vomiting Anesthetic complications: no     Last Vitals:  Vitals:   05/08/19 0909 05/08/19 1022  BP:  (!) 104/56  Pulse: 63 86  Resp: 16 (!) 26  Temp:  (P) 36.7 C  SpO2: 97% 95%    Last Pain:  Vitals:   05/08/19 0905  PainSc: 0-No pain                 ADAMS, AMY A

## 2019-05-08 NOTE — Op Note (Signed)
[facility] Patient Name: Sharon Macdonald Procedure Date: 05/08/2019 9:25 AM MRN: [id number] Date of Birth: 07-29-1966 Attending MD: Norvel Richards , MD CSN: [id number] Age: 53 Admit Type: Outpatient Procedure:                Colonoscopy Indications:              Chronic diarrhea Providers:                Norvel Richards, MD, Janeece Riggers, RN, Raphael Gibney, Technician Referring MD:              Medicines:                Propofol per Anesthesia Complications:            No immediate complications. Estimated Blood Loss:     Estimated blood loss: none. Estimated blood loss                            was minimal. Procedure:                Pre-Anesthesia Assessment:                           - Prior to the procedure, a History and Physical                            was performed, and patient medications and                            allergies were reviewed. The patient's tolerance of                            previous anesthesia was also reviewed. The risks                            and benefits of the procedure and the sedation                            options and risks were discussed with the patient.                            All questions were answered, and informed consent                            was obtained. Prior Anticoagulants: The patient has                            taken no previous anticoagulant or antiplatelet                            agents. ASA Grade Assessment: II - A patient with                            mild systemic  disease. After reviewing the risks                            and benefits, the patient was deemed in                            satisfactory condition to undergo the procedure.                           After obtaining informed consent, the colonoscope                            was passed under direct vision. Throughout the                            procedure, the patient's blood pressure, pulse,  and                            oxygen saturations were monitored continuously. The                            [id number] scope was introduced through                            the anus and advanced to the the cecum, identified                            by appendiceal orifice and ileocecal valve. The                            ileocecal valve, appendiceal orifice, and rectum                            were photographed. Scope In: 9:40:50 AM Scope Out: 10:10:50 AM Scope Withdrawal Time: 0 hours 8 minutes 35 seconds  Total Procedure Duration: 0 hours 30 minutes 0 seconds  Findings:      The perianal and digital rectal examinations were normal.      The colon (entire examined portion) appeared normal. Long redundant       colon requiring changing of the patient's position and external       abdominal pressure to reach the cecum.      Non-bleeding internal hemorrhoids were found during retroflexion. The       hemorrhoids were mild, small and Grade I (internal hemorrhoids that do       not prolapse). Status post segmental biopsy and stool collection       (patient previously did not submit samples) Impression:               - The entire examined colon is normal.                            Redundant/elongated colon. Status post biopsy.                            Status post stool collection.                           -  Non-bleeding internal hemorrhoids.                           - No specimens collected. Moderate Sedation:      Moderate (conscious) sedation was personally administered by an       anesthesia professional. The following parameters were monitored: oxygen       saturation, heart rate, blood pressure, respiratory rate, EKG, adequacy       of pulmonary ventilation, and response to care. Recommendation:           - Repeat colonoscopy in 10 years for screening                            purposes. Follow-up on pending studies.                           - Return to GI  office (date not yet determined). Procedure Code(s):        --- Professional ---                           [phone], Colonoscopy, flexible; diagnostic, including                            collection of specimen(s) by brushing or washing,                            when performed (separate procedure) Diagnosis Code(s):        --- Professional ---                           K64.0, First degree hemorrhoids                           K52.9, Noninfective gastroenteritis and colitis,                            unspecified CPT copyright 2019 American Medical Association. All rights reserved. The codes documented in this report are preliminary and upon coder review may  be revised to meet current compliance requirements. Cristopher Estimable. Rourk, MD Norvel Richards, MD 05/08/2019 10:22:29 AM This report has been signed electronically. Number of Addenda: 0

## 2019-05-08 NOTE — Interval H&P Note (Signed)
History and Physical Interval Note:  05/08/2019 9:31 AM  Sharon Macdonald  has presented today for surgery, with the diagnosis of abd pain, diarrhea.  The various methods of treatment have been discussed with the patient and family. After consideration of risks, benefits and other options for treatment, the patient has consented to  Procedure(s) with comments: COLONOSCOPY WITH PROPOFOL (N/A) - 1:45pm-office moved up to 10:15am as a surgical intervention.  The patient's history has been reviewed, patient examined, no change in status, stable for surgery.  I have reviewed the patient's chart and labs.  Questions were answered to the patient's satisfaction.     Sharon Macdonald  Acute on chronic diarrhea.  Some bleeding.  Patient was unable to obtain stool studies.  Ileocolonoscopy today per plan.  No prior colonoscopy. The risks, benefits, limitations, alternatives and imponderables have been reviewed with the patient. Questions have been answered. All parties are agreeable.

## 2019-05-08 NOTE — Discharge Instructions (Signed)
°  Colonoscopy Discharge Instructions  Read the instructions outlined below and refer to this sheet in the next few weeks. These discharge instructions provide you with general information on caring for yourself after you leave the hospital. Your doctor may also give you specific instructions. While your treatment has been planned according to the most current medical practices available, unavoidable complications occasionally occur. If you have any problems or questions after discharge, call Dr. Gala Romney at [phone]. ACTIVITY  You may resume your regular activity, but move at a slower pace for the next 24 hours.   Take frequent rest periods for the next 24 hours.   Walking will help get rid of the air and reduce the bloated feeling in your belly (abdomen).   No driving for 24 hours (because of the medicine (anesthesia) used during the test).    Do not sign any important legal documents or operate any machinery for 24 hours (because of the anesthesia used during the test).  NUTRITION  Drink plenty of fluids.   You may resume your normal diet as instructed by your doctor.   Begin with a light meal and progress to your normal diet. Heavy or fried foods are harder to digest and may make you feel sick to your stomach (nauseated).   Avoid alcoholic beverages for 24 hours or as instructed.  MEDICATIONS  You may resume your normal medications unless your doctor tells you otherwise.  WHAT YOU CAN EXPECT TODAY  Some feelings of bloating in the abdomen.   Passage of more gas than usual.   Spotting of blood in your stool or on the toilet paper.  IF YOU HAD POLYPS REMOVED DURING THE COLONOSCOPY:  No aspirin products for 7 days or as instructed.   No alcohol for 7 days or as instructed.   Eat a soft diet for the next 24 hours.  FINDING OUT THE RESULTS OF YOUR TEST Not all test results are available during your visit. If your test results are not back during the visit, make an appointment  with your caregiver to find out the results. Do not assume everything is normal if you have not heard from your caregiver or the medical facility. It is important for you to follow up on all of your test results.  SEEK IMMEDIATE MEDICAL ATTENTION IF:  You have more than a spotting of blood in your stool.   Your belly is swollen (abdominal distention).   You are nauseated or vomiting.   You have a temperature over 101.   You have abdominal pain or discomfort that is severe or gets worse throughout the day.   Further recommendations to follow pending review of pathology report  Repeat colonoscopy in 10 years for screening purposes  I called patient's boyfriend, Breck Coons, at [phone] at patient's request.  I got no answer.

## 2019-05-08 NOTE — Transfer of Care (Signed)
Immediate Anesthesia Transfer of Care Note  Patient: Sharon Macdonald  Procedure(s) Performed: COLONOSCOPY WITH PROPOFOL (N/A ) BIOPSY  Patient Location: PACU  Anesthesia Type:General  Level of Consciousness: awake, alert , oriented and patient cooperative  Airway & Oxygen Therapy: Patient Spontanous Breathing  Post-op Assessment: Report given to RN and Post -op Vital signs reviewed and stable  Post vital signs: Reviewed and stable  Last Vitals:  Vitals Value Taken Time  BP 104/56 05/08/19 1022  Temp    Pulse 86 05/08/19 1022  Resp 26 05/08/19 1022  SpO2 95 % 05/08/19 1022    Last Pain:  Vitals:   05/08/19 0905  PainSc: 0-No pain         Complications: No apparent anesthesia complications

## 2019-05-08 NOTE — Anesthesia Preprocedure Evaluation (Addendum)
Anesthesia Evaluation  Patient identified by MRN, date of birth, ID band Patient awake    Reviewed: Allergy & Precautions, NPO status , Patient's Chart, lab work & pertinent test results  Airway Mallampati: II  TM Distance: >3 FB Neck ROM: Full    Dental  (+) Edentulous Upper, Edentulous Lower   Pulmonary Current Smoker (vaping)Patient did not abstain from smoking.,  H/o bronchitis   Pulmonary exam normal breath sounds clear to auscultation       Cardiovascular METS: 3 - Mets Normal cardiovascular exam Rhythm:Regular Rate:Normal     Neuro/Psych Anxiety negative neurological ROS     GI/Hepatic negative GI ROS, Neg liver ROS,   Endo/Other  Morbid obesity  Renal/GU      Musculoskeletal   Abdominal   Peds  Hematology   Anesthesia Other Findings   Reproductive/Obstetrics                            Anesthesia Physical Anesthesia Plan  ASA: III  Anesthesia Plan: General   Post-op Pain Management:    Induction: Intravenous  PONV Risk Score and Plan:   Airway Management Planned:   Additional Equipment:   Intra-op Plan:   Post-operative Plan:   Informed Consent: I have reviewed the patients History and Physical, chart, labs and discussed the procedure including the risks, benefits and alternatives for the proposed anesthesia with the patient or authorized representative who has indicated his/her understanding and acceptance.       Plan Discussed with: CRNA  Anesthesia Plan Comments:        Anesthesia Quick Evaluation

## 2019-05-09 LAB — GASTROINTESTINAL PANEL BY PCR, STOOL (REPLACES STOOL CULTURE)

## 2019-05-10 ENCOUNTER — Encounter (HOSPITAL_COMMUNITY): Payer: Self-pay | Admitting: Internal Medicine

## 2019-05-10 ENCOUNTER — Encounter: Payer: Self-pay | Admitting: Internal Medicine

## 2019-06-06 ENCOUNTER — Ambulatory Visit: Payer: Medicaid Other | Admitting: Podiatry

## 2019-07-04 ENCOUNTER — Other Ambulatory Visit: Payer: Self-pay

## 2019-07-04 ENCOUNTER — Ambulatory Visit (INDEPENDENT_AMBULATORY_CARE_PROVIDER_SITE_OTHER): Payer: Medicaid Other | Admitting: Nurse Practitioner

## 2019-07-04 ENCOUNTER — Encounter: Payer: Self-pay | Admitting: Nurse Practitioner

## 2019-07-04 DIAGNOSIS — K6289 Other specified diseases of anus and rectum: Secondary | ICD-10-CM | POA: Diagnosis not present

## 2019-07-04 DIAGNOSIS — R197 Diarrhea, unspecified: Secondary | ICD-10-CM | POA: Diagnosis not present

## 2019-07-04 DIAGNOSIS — R109 Unspecified abdominal pain: Secondary | ICD-10-CM | POA: Diagnosis not present

## 2019-07-04 MED ORDER — HYOSCYAMINE SULFATE SL 0.125 MG SL SUBL: 1.0000 | SUBLINGUAL_TABLET | Freq: Four times a day (QID) | SUBLINGUAL | 3 refills | Status: DC | PRN

## 2019-07-04 NOTE — Progress Notes (Signed)
Referring Provider: Monico Blitz, MD Primary Care Physician:  Medicine, [facility] Primary GI:  Dr. Gala Romney  NOTE: Service was provided via telemedicine and was requested by the patient due to COVID-19 pandemic.  Method of visit: Doxy.Me  Patient Location: Home  Provider Location: Office  Reason for Phone Visit: Follow-up  The patient was consented to phone follow-up via telephone encounter including billing of the encounter (yes/no): Yes  Persons present on the phone encounter, with roles: Celesta Gentile and daughter  Total time (minutes) spent on medical discussion: 22 minutes  Chief Complaint  Patient presents with   Abdominal Pain    f/u. only with BM   Diarrhea    f/u. "everytime I eat", no bleeding    HPI:   Sharon Macdonald is a 53 y.o. female who presents for virtual visit regarding: Follow-up on abdominal pain and diarrhea.  The patient was last seen in our office 05/14/2019 as well as rectal pain.  Noted history of IBS diarrhea type.  Previously recommended Bentyl 4 times a day, Imodium as needed, colonoscopy.  At her last visit it was noted her to be rescheduled for later that week.  Bentyl has not helped her abdominal pain or diarrhea even at 4 times a day dosing.  Initially Bentyl was effective but then stopped.  12 stools a day the best majority of the time it is watery diarrhea.  She previously took Imodium which did help her diarrhea but made her sick to her stomach.  Pain is lower abdomen, cannot lay on left side when she has a bowel movement her abdominal pain improves.  She has rectal discomfort with frequent stools.  No identified triggers.  Has tried multiple medications without luck.  Status post cholecystectomy.  Has GERD-like symptoms that she feels is related to her hiatal hernia and avoids triggers.  She notes that overall her GERD symptoms are much better than they used to be.  Recommended stool studies, keep colonoscopy appointment, Preparation H for rectal  discomfort, follow-up in 2 months.  Colonoscopy completed 05/08/2019 which found entire examined colon found to be normal, redundant/elongated colon, status post random colon biopsies and stool collection.  Nonbleeding internal hemorrhoids.  Recommended follow-up colonoscopy in 10 years for screening purposes.  Surgical pathology of the random colon biopsies found no significant pathological findings.  C. difficile, GI path panel all negative.  Today she states she's doing ok overall. Currently has abdominal pain when she has a bowel movement. Has a bowel movement everytime she eats. Has a bowel movement 5+ times a day, watery. Drinking plenty of fluids. Abdominal pain stops about 15-20 minutes after a bowel movement. She has tried multiple OTC options for diarrhea. Denies N/V, hematochezia, melena, fever, chills, unintentional weight loss. Abdominal hernia acts up every now and then. Mostly bothers her if she eats spicy foods. Denies URI or flu-like symptoms. Denies loss of sense of taste or smell. Denies chest pain, dyspnea, dizziness, lightheadedness, syncope, near syncope. Denies any other upper or lower GI symptoms.  She is s/p cholecystectomy and therefore not a candidate for Viberzi.  Past Medical History:  Diagnosis Date   Anxiety 07/13/2017   IBS (irritable bowel syndrome)    Restless leg syndrome     Past Surgical History:  Procedure Laterality Date   APPENDECTOMY     BIOPSY  05/08/2019   Procedure: BIOPSY;  Surgeon: Daneil Dolin, MD;  Location: [facility] SU[facility];  Service: Endoscopy;;   CHOLECYSTECTOMY  COLONOSCOPY WITH PROPOFOL N/A 05/08/2019   Procedure: COLONOSCOPY WITH PROPOFOL;  Surgeon: Daneil Dolin, MD;  Location: [facility] SUITE;  Service: Endoscopy;  Laterality: N/A;  1:45pm-office moved up to 10:15am    Current Outpatient Medications  Medication Sig Dispense Refill   albuterol (PROAIR HFA) 108 (90 Base) MCG/ACT inhaler Inhale 1-2 puffs into the lungs every 6  (six) hours as needed for wheezing or shortness of breath.     busPIRone (BUSPAR) 15 MG tablet Take 15 mg by mouth 2 (two) times daily as needed (anxiety).      dicyclomine (BENTYL) 10 MG capsule Take 1 capsule (10 mg total) by mouth 4 (four) times daily as needed (for abdominal pain or diarrhea). 120 capsule 1   escitalopram (LEXAPRO) 20 MG tablet Take 20 mg by mouth daily.     ondansetron (ZOFRAN) 4 MG tablet Take 1 tablet (4 mg total) by mouth every 8 (eight) hours as needed for nausea or vomiting. 12 tablet 0   No current facility-administered medications for this visit.     Allergies as of 07/04/2019   (No Known Allergies)    Family History  Problem Relation Age of Onset   Cancer Mother    Colon cancer Neg Hx     Social History   Socioeconomic History   Marital status: Married    Spouse name: Not on file   Number of children: Not on file   Years of education: Not on file   Highest education level: Not on file  Occupational History   Not on file  Social Needs   Financial resource strain: Not on file   Food insecurity    Worry: Not on file    Inability: Not on file   Transportation needs    Medical: Not on file    Non-medical: Not on file  Tobacco Use   Smoking status: Current Every Day Smoker    Types: E-cigarettes   Smokeless tobacco: Never Used  Substance and Sexual Activity   Alcohol use: No   Drug use: No   Sexual activity: Yes    Birth control/protection: Surgical  Lifestyle   Physical activity    Days per week: Not on file    Minutes per session: Not on file   Stress: Not on file  Relationships   Social connections    Talks on phone: Not on file    Gets together: Not on file    Attends religious service: Not on file    Active member of club or organization: Not on file    Attends meetings of clubs or organizations: Not on file    Relationship status: Not on file  Other Topics Concern   Not on file  Social History Narrative    Not on file    Review of Systems: General: Negative for anorexia, weight loss, fever, chills, fatigue, weakness. ENT: Negative for hoarseness, difficulty swallowing. CV: Negative for chest pain, angina, palpitations, peripheral edema.  Respiratory: Negative for dyspnea at rest, cough, sputum, wheezing.  GI: See history of present illness. Endo: Negative for unusual weight change.  Heme: Negative for bruising or bleeding. Allergy: Negative for rash or hives.  Physical Exam: Note: limited exam due to virtual visit General:   Alert and oriented. Pleasant and cooperative. Well-nourished and well-developed.  Head:  Normocephalic and atraumatic. Eyes:  Without icterus, sclera clear and conjunctiva pink.  Ears:  Normal auditory acuity. Skin:  Intact without facial significant lesions or rashes. Neurologic:  Alert and  oriented x4;  grossly normal neurologically. Psych:  Alert and cooperative. Normal mood and affect. Heme/Lymph/Immune: No excessive bruising noted.

## 2019-07-04 NOTE — Patient Instructions (Signed)
Your health issues we discussed today were:   Abdominal pain and diarrhea: 1. Stop taking dicyclomine (Bentyl). 2. I have sent a prescription for hyoscyamine (Levsin) 0.125 mg to your pharmacy.  You can take this up to 4 times a day as needed for diarrhea/abdominal pain 3. You can also go to the pharmacy and look into probiotics.  Some brands we have had success with include Align, [employer], and Restora 4. Call us in 3 to 4 weeks and let us know if any of these have helped her symptoms 5. Call us if you have any worsening or severe symptoms.  Overall I recommend:  1. Continue your other current medications 2. Return for follow-up in 2 months 3. Call us if you have any questions or concerns.   Because of recent events of COVID-19 ("Coronavirus"), follow CDC recommendations:  1. Wash your hand frequently 2. Avoid touching your face 3. Stay away from people who are sick 4. If you have symptoms such as fever, cough, shortness of breath then call your healthcare provider for further guidance 5. If you are sick, STAY AT HOME unless otherwise directed by your healthcare provider. 6. Follow directions from state and national officials regarding staying safe   At [facility] we value your feedback. You may receive a survey about your visit today. Please share your experience as we strive to create trusting relationships with our patients to provide genuine, compassionate, quality care.  We appreciate your understanding and patience as we review any laboratory studies, imaging, and other diagnostic tests that are ordered as we care for you. Our office policy is 5 business days for review of these results, and any emergent or urgent results are addressed in a timely manner for your best interest. If you do not hear from our office in 1 week, please contact us.   We also encourage the use of MyChart, which contains your medical information for your review as well. If you  are not enrolled in this feature, an access code is on this after visit summary for your convenience. Thank you for allowing [place] to be involved in your care.  It was great to see you today!  I hope you have a Happy Thanksgiving!!

## 2019-07-04 NOTE — Assessment & Plan Note (Signed)
The patient describes abdominal pain only when she has a bowel movement and it typically resolves on its own about 15 to 20 minutes after a bowel movement.  This is likely IBS diarrhea type as per above.  Further management as per above.  Follow-up in 2 months.

## 2019-07-04 NOTE — Assessment & Plan Note (Signed)
Persistent diarrhea 5+ times a day described as watery.  Associated abdominal pain only when she is having a bowel movement.  Typically has postprandial stools.  She has tried and failed Imodium and Bentyl.  C. difficile and GI pathogen panel is negative.  Colonoscopy unremarkable.  Likely IBS diarrhea type.  At this point I will trial her on Levsin 0.125 four times a day as needed.  Request progress report in 3 to 4 weeks.  I will also have her try probiotics for 1 month to see if this makes a difference.  Follow-up in 2 months.

## 2019-07-04 NOTE — Assessment & Plan Note (Signed)
No further complaints of rectal pain with topical therapy.  Continue current medications and follow-up in 2 months.

## 2019-07-06 ENCOUNTER — Other Ambulatory Visit: Payer: Self-pay

## 2019-07-06 DIAGNOSIS — Z20822 Contact with and (suspected) exposure to covid-19: Secondary | ICD-10-CM

## 2019-07-08 LAB — NOVEL CORONAVIRUS, NAA: SARS-CoV-2, NAA: NOT DETECTED

## 2019-07-19 ENCOUNTER — Other Ambulatory Visit: Payer: Self-pay

## 2019-07-19 DIAGNOSIS — Z20822 Contact with and (suspected) exposure to covid-19: Secondary | ICD-10-CM

## 2019-07-21 ENCOUNTER — Telehealth: Payer: Self-pay

## 2019-07-21 LAB — NOVEL CORONAVIRUS, NAA: SARS-CoV-2, NAA: NOT DETECTED

## 2019-07-21 NOTE — Telephone Encounter (Signed)
Negative COVID results given. Patient results "NOT Detected." Caller expressed understanding. ° °

## 2019-08-08 ENCOUNTER — Other Ambulatory Visit: Payer: Self-pay

## 2019-08-08 ENCOUNTER — Emergency Department (HOSPITAL_COMMUNITY)
Admission: EM | Admit: 2019-08-08 | Discharge: 2019-08-08 | Disposition: A | Discharge status: Home / Self Care | Payer: Medicaid Other | Attending: Emergency Medicine | Admitting: Emergency Medicine

## 2019-08-08 ENCOUNTER — Encounter (HOSPITAL_COMMUNITY): Payer: Self-pay | Admitting: *Deleted

## 2019-08-08 ENCOUNTER — Emergency Department (HOSPITAL_COMMUNITY): Payer: Medicaid Other

## 2019-08-08 DIAGNOSIS — Z79899 Other long term (current) drug therapy: Secondary | ICD-10-CM | POA: Insufficient documentation

## 2019-08-08 DIAGNOSIS — F17290 Nicotine dependence, other tobacco product, uncomplicated: Secondary | ICD-10-CM | POA: Diagnosis not present

## 2019-08-08 DIAGNOSIS — M25522 Pain in left elbow: Secondary | ICD-10-CM

## 2019-08-08 NOTE — ED Provider Notes (Signed)
[facility] Provider Note   CSN: [id number] Arrival date & time: 08/08/19  1522     History Chief Complaint  Patient presents with  . Arm Pain    Sharon Macdonald is a 53 y.o. female presents today for evaluation of elbow pain.  Reports long history of having issues with her left elbow, occasionally gets "locking" and pain there but this afternoon she picked up a case of water and had sudden onset, sharp pain in the medial aspect of her elbow that has not resolved.  Reports for some time she has had tingling in her index, middle and ring fingers.  No previous injuries to the elbow or surgeries.  She is left-hand dominant.  Denies any direct trauma, swelling, warmth, fevers.  No neck pain.  She has tried ice without improvement.  Pain is worse with full extension of the elbow, better if she keeps her elbow in flexed position.  HPI     Past Medical History:  Diagnosis Date  . Anxiety 07/13/2017  . IBS (irritable bowel syndrome)   . Restless leg syndrome     Patient Active Problem List   Diagnosis Date Noted  . Rectal pain 05/02/2019  . Diarrhea 01/09/2019  . Abdominal pain 01/09/2019    Past Surgical History:  Procedure Laterality Date  . APPENDECTOMY    . BIOPSY  05/08/2019   Procedure: BIOPSY;  Surgeon: Daneil Dolin, MD;  [facility];  Service: Endoscopy;;  . CHOLECYSTECTOMY    . COLONOSCOPY WITH PROPOFOL N/A 05/08/2019   Procedure: COLONOSCOPY WITH PROPOFOL;  Surgeon: Daneil Dolin, MD;  Location: [facility];  Service: Endoscopy;  Laterality: N/A;  1:45pm-office moved up to 10:15am     OB History    Gravida  4   Para  4   Term  4   Preterm      AB      Living        SAB      TAB      Ectopic      Multiple      Live Births              Family History  Problem Relation Age of Onset  . Cancer Mother   . Colon cancer Neg Hx     Social History   Tobacco Use  . Smoking status: Current Every Day Smoker      Types: E-cigarettes  . Smokeless tobacco: Never Used  Substance Use Topics  . Alcohol use: No  . Drug use: No    Home Medications Prior to Admission medications   Medication Sig Start Date End Date Taking? Authorizing Provider  albuterol (PROAIR HFA) 108 (90 Base) MCG/ACT inhaler Inhale 1-2 puffs into the lungs every 6 (six) hours as needed for wheezing or shortness of breath.    [provider]  busPIRone (BUSPAR) 15 MG tablet Take 15 mg by mouth 2 (two) times daily as needed (anxiety).  08/14/18   [provider]  escitalopram (LEXAPRO) 20 MG tablet Take 20 mg by mouth daily.    [provider]  gabapentin (NEURONTIN) 300 MG capsule Take 300 mg by mouth at bedtime. 06/09/19   [provider]  Hyoscyamine Sulfate SL (LEVSIN/SL) 0.125 MG SUBL Place 1 tablet under the tongue 4 (four) times daily as needed (diarrhea/abdominal pain). 07/04/19   Carlis Stable, NP  ondansetron (ZOFRAN) 4 MG tablet Take 1 tablet (4 mg total) by  mouth every 8 (eight) hours as needed for nausea or vomiting. 01/11/17   Mesner, Corene Cornea, MD    Allergies    Patient has no known allergies.  Review of Systems   Review of Systems  Musculoskeletal: Positive for arthralgias.  Neurological:       Tingling  All other systems reviewed and are negative.   Physical Exam Updated Vital Signs BP 134/72 (BP Location: Right Arm)   Pulse 73   Temp 97.9 F (36.6 C) (Oral)   Ht 5\' 8"  (1.727 m)   Wt 122.9 kg   LMP 08/30/2015 (Approximate)   SpO2 100%   BMI 41.21 kg/m   Physical Exam Constitutional:      Appearance: She is well-developed.  HENT:     Head: Normocephalic.     Nose: Nose normal.  Eyes:     General: Lids are normal.  Neck:     Comments: No midline or paraspinal muscle tenderness.  Full range of motion of the neck without pain.  No trapezius tenderness.  Negative Spurling's and Adson's test. Cardiovascular:     Rate and Rhythm: Normal rate.     Comments: 1+ radial  pulses bilaterally. Pulmonary:     Effort: Pulmonary effort is normal. No respiratory distress.  Musculoskeletal:        General: Tenderness present. Normal range of motion.     Cervical back: Normal range of motion.     Comments: Left elbow: Focal tenderness along the medial elbow between the medial epicondyle and olecranon process.  Decreased extension secondary to pain.  Full flexion, supination and pronation.  No focal tenderness to olecranon, lateral elbow or epicondyle, upper arm or lower arm compartments.  No joint erythema, warmth.  No skin abnormalities.  Humerus and proximal/medial/distal radial and ulna nontender.  Left wrist: No focal bony tenderness.  No scaphoid tenderness.  Full range of motion of the wrist without any pain.  Neurological:     Mental Status: She is alert.     Comments: Sensation in radial, medial and ulnar nerve distribution in the hand intact.  5/5 strength with handgrip.  Psychiatric:        Behavior: Behavior normal.     ED Results / Procedures / Treatments   Labs (all labs ordered are listed, but only abnormal results are displayed) Labs Reviewed - No data to display  EKG None  Radiology DG Elbow Complete Left  Result Date: 08/08/2019 CLINICAL DATA:  Left elbow pain EXAM: LEFT ELBOW - COMPLETE 3+ VIEW COMPARISON:  None. FINDINGS: Degenerative changes with joint space narrowing and spurring. No joint effusion. No acute bony abnormality. Specifically, no fracture, subluxation, or dislocation. IMPRESSION: No acute bony abnormality. Electronically Signed   By: Rolm Baptise M.D.   On: 08/08/2019 18:28    Procedures Procedures (including critical care time)  Medications Ordered in ED Medications - No data to display  ED Course  I have reviewed the triage vital signs and the nursing notes.  Pertinent labs & imaging results that were available during my care of the patient were reviewed by me and considered in my medical decision making (see  chart for details).    MDM Rules/Calculators/A&P                      53 year old with acute on chronic left medial elbow pain and tingling in median nerve distribution.  Clinically highest on DDX is soft tissue injury possibly peripheral neuropathy of the median nerve.  X-rays  today are nonacute.  Extremity is neurovascularly intact with soft compartments.  No signs of infectious process.  Will DC with symptomatic management including NSAIDs, ice, rest, sling and follow-up with orthopedist for reevaluation.  Return precautions discussed.  Patient comfortable with this. Final Clinical Impression(s) / ED Diagnoses Final diagnoses:  Left elbow pain    Rx / DC Orders ED Discharge Orders    None       Kinnie Feil, PA-C 08/08/19 1837    Veryl Speak, MD 08/08/19 2314

## 2019-08-08 NOTE — ED Triage Notes (Signed)
Left elbow pain  , states it started after picking up some water

## 2019-08-08 NOTE — Discharge Instructions (Addendum)
You were seen in the ER for elbow pain  X-rays are normal  I suspect your symptoms are from soft tissue overuse injury of ligaments or tendons or nerves  For pain and inflammation you can use a combination of ibuprofen and acetaminophen.  Take [phone] mg acetaminophen (tylenol) every 6 hours or 600 mg ibuprofen (advil, motrin) every 6 hours.  You can take these separately or combine them every 6 hours for maximum pain control. Do not exceed 4,000 mg acetaminophen or 2,400 mg ibuprofen in a 24 hour period.  Do not take ibuprofen containing products if you have history of kidney disease, ulcers, GI bleeding, severe acid reflux, or take a blood thinner.  Do not take acetaminophen if you have liver disease.   Wear sling for comfort for the next 2-3 days, then remove and only use as needed.  Ice.   Your symptoms should improve over the next 7-10 days  Follow up with orthopedist if symptoms are not improving or causing limitations at work or during activities at home, they may do more imaging studies or recommend other treatments  Return for increased pain, joint swelling redness warmth fevers neck pain loss of sensation or paralysis/weakness in extremity

## 2019-09-05 ENCOUNTER — Telehealth: Payer: Self-pay | Admitting: *Deleted

## 2019-09-05 ENCOUNTER — Other Ambulatory Visit: Payer: Self-pay

## 2019-09-05 ENCOUNTER — Ambulatory Visit (INDEPENDENT_AMBULATORY_CARE_PROVIDER_SITE_OTHER): Payer: Medicaid Other | Admitting: Nurse Practitioner

## 2019-09-05 ENCOUNTER — Encounter: Payer: Self-pay | Admitting: Internal Medicine

## 2019-09-05 ENCOUNTER — Encounter: Payer: Self-pay | Admitting: Nurse Practitioner

## 2019-09-05 DIAGNOSIS — R109 Unspecified abdominal pain: Secondary | ICD-10-CM | POA: Diagnosis not present

## 2019-09-05 DIAGNOSIS — R197 Diarrhea, unspecified: Secondary | ICD-10-CM | POA: Diagnosis not present

## 2019-09-05 MED ORDER — DIPHENOXYLATE-ATROPINE 2.5-0.025 MG PO TABS: 1.0000 | ORAL_TABLET | Freq: Three times a day (TID) | ORAL | 3 refills | Status: DC

## 2019-09-05 NOTE — Telephone Encounter (Signed)
PA pending review via evicore website. Tracking# [id number]

## 2019-09-05 NOTE — Progress Notes (Signed)
CC'ED TO PCP 

## 2019-09-05 NOTE — Progress Notes (Signed)
Referring Provider: Medicine, [facility] Internal Primary Care Physician:  Medicine, [facility] Internal Primary GI:  Dr. Gala Romney  NOTE: Service was provided via telemedicine and was requested by the patient due to COVID-19 pandemic.  Method of visit: Doxy.Me  Patient Location: Home  Provider Location: Office  Reason for Phone Visit: Follow-up  The patient was consented to phone follow-up via telephone encounter including billing of the encounter (yes/no): Yes  Persons present on the phone encounter, with roles: None  Total time (minutes) spent on medical discussion: 17 minutes  Chief Complaint  Patient presents with  . Diarrhea    sometimes doesn't make it to bathroom; usually after eating  . Abdominal Pain    lower abd    HPI:   Sharon Macdonald is a 54 y.o. female who presents for virtual visit regarding: follow-up on diarrhea and abdominal pain.  The patient was last seen in our office 07/04/2019 for the same.  Noted history of IBS diarrhea type.  Previously trialed and failed Bentyl.  Imodium helped her stools but made her "sick to her stomach."  Lower abdominal pain with improvement after bowel movement as per typical for IBS.  Also history of GERD symptoms.  Colonoscopy on file 05/08/2019 which was essentially normal status post random colon biopsies found to have no significant histopathological changes.  Recommended repeat in 10 years.  Stool studies all negative.  At her last visit she noted abdominal pain with bowel movements which occur 5 times a day and are watery.  Drinking plenty of fluids.  Pain stops about 15 to 20 minutes after a bowel movement.  Has tried multiple over-the-counter medications.  Abdominal hernia flares intermittently mostly if she eats spicy foods.  She is status post cholecystectomy and therefore not a candidate for Viberzi.  Recommended stop Bentyl, start hyoscyamine 0.1254 times a day as needed for diarrhea or abdominal pain.  Trial of probiotics as well.   Call in 3 to 4 weeks with a progress report, otherwise follow-up in 2 months.  No progress report was called to our office.  Today she states she's doing ok overall. Still with diarrhea, usually after eating. At times can't make it to the bathroom in time and will have incontinence. Her diarrhea is associated with lower abdominal pain that is described as crampy; occurs when she has to have a bowel movement and improves after a bowel movement. She is not taking hyoscyamine as previously recommended and states "it worked for 2 days then quit." She is currently taking Kaopectate for diarrhea which improves her symptoms somewhat (more in preventing urgency/incontinence) and not the number of stools. Has a bowel movement about 5 minutes after eating, every time she eats. She has an abdominal hernia which is aggravated by kaopectate. Previously not very bothersome, but has gotten worse recently; has not seen a Psychologist, sport and exercise. Denies N/V, hematochezia, melena, fever, chills, unintentional weight loss. Stress level is high recently due to her abdominal symptoms. Denies URI or flu-like symptoms. Denies loss of sense of taste or smell. Denies chest pain, dyspnea, dizziness, lightheadedness, syncope, near syncope. Denies any other upper or lower GI symptoms.  Past Medical History:  Diagnosis Date  . Anxiety 07/13/2017  . IBS (irritable bowel syndrome)   . Restless leg syndrome     Past Surgical History:  Procedure Laterality Date  . APPENDECTOMY    . BIOPSY  05/08/2019   Procedure: BIOPSY;  Surgeon: Daneil Dolin, MD;  Location: [facility];  Service: Endoscopy;;  .  CHOLECYSTECTOMY    . COLONOSCOPY WITH PROPOFOL N/A 05/08/2019   Procedure: COLONOSCOPY WITH PROPOFOL;  Surgeon: Daneil Dolin, MD;  Location: [facility];  Service: Endoscopy;  Laterality: N/A;  1:45pm-office moved up to 10:15am    Current Outpatient Medications  Medication Sig Dispense Refill  . busPIRone (BUSPAR) 15 MG tablet Take 15 mg  by mouth 2 (two) times daily as needed (anxiety).     Marland Kitchen escitalopram (LEXAPRO) 20 MG tablet Take 20 mg by mouth daily.    Marland Kitchen gabapentin (NEURONTIN) 300 MG capsule Take 300 mg by mouth at bedtime as needed.     Marland Kitchen Hyoscyamine Sulfate SL (LEVSIN/SL) 0.125 MG SUBL Place 1 tablet under the tongue 4 (four) times daily as needed (diarrhea/abdominal pain). 120 tablet 3  . ondansetron (ZOFRAN) 4 MG tablet Take 1 tablet (4 mg total) by mouth every 8 (eight) hours as needed for nausea or vomiting. 12 tablet 0  . diphenoxylate-atropine (LOMOTIL) 2.5-0.025 MG tablet Take 1 tablet by mouth 3 (three) times daily before meals. 90 tablet 3   No current facility-administered medications for this visit.    Allergies as of 09/05/2019  . (No Known Allergies)    Family History  Problem Relation Age of Onset  . Cancer Mother   . Colon cancer Neg Hx     Social History   Socioeconomic History  . Marital status: Married    Spouse name: Not on file  . Number of children: Not on file  . Years of education: Not on file  . Highest education level: Not on file  Occupational History  . Not on file  Tobacco Use  . Smoking status: Current Every Day Smoker    Types: E-cigarettes  . Smokeless tobacco: Never Used  . Tobacco comment: e-ciggarettes daily  Substance and Sexual Activity  . Alcohol use: No  . Drug use: No  . Sexual activity: Yes    Birth control/protection: Surgical  Other Topics Concern  . Not on file  Social History Narrative  . Not on file   Social Determinants of Health   Financial Resource Strain:   . Difficulty of Paying Living Expenses: Not on file  Food Insecurity:   . Worried About Charity fundraiser in the Last Year: Not on file  . Ran Out of Food in the Last Year: Not on file  Transportation Needs:   . Lack of Transportation (Medical): Not on file  . Lack of Transportation (Non-Medical): Not on file  Physical Activity:   . Days of Exercise per Week: Not on file  . Minutes  of Exercise per Session: Not on file  Stress:   . Feeling of Stress : Not on file  Social Connections:   . Frequency of Communication with Friends and Family: Not on file  . Frequency of Social Gatherings with Friends and Family: Not on file  . Attends Religious Services: Not on file  . Active Member of Clubs or Organizations: Not on file  . Attends Archivist Meetings: Not on file  . Marital Status: Not on file    Review of Systems: General: Negative for anorexia, weight loss, fever, chills, fatigue, weakness. ENT: Negative for hoarseness, difficulty swallowing. CV: Negative for chest pain, angina, palpitations, peripheral edema.  Respiratory: Negative for dyspnea at rest, cough, sputum, wheezing.  GI: See history of present illness. Endo: Negative for unusual weight change.  Heme: Negative for bruising or bleeding. Allergy: Negative for rash or hives.  Physical Exam:  Note: limited exam due to virtual visit General:   Alert and oriented. Pleasant and cooperative. Well-nourished and well-developed.  Eyes:  Without icterus, sclera clear and conjunctiva pink.  Ears:  Normal auditory acuity. Skin:  Intact without facial significant lesions or rashes. Neurologic:  Alert and oriented x4;  grossly normal neurologically. Psych:  Alert and cooperative. Normal mood and affect. Heme/Lymph/Immune: No excessive bruising noted.

## 2019-09-05 NOTE — Assessment & Plan Note (Signed)
The patient does have lower abdominal pain that is typical with her IBS-D.  However, she has had some increasing mid abdominal pain with a known hernia, per the patient.  She feels like her hernia is causing her more abdominal pain symptoms especially in the setting of Kaopectate.  At this point I will put in for CT of the abdomen to further evaluate her hernia for any worsening or warning signs.  Further recommendations to follow.  Follow-up in 6 months.

## 2019-09-05 NOTE — Assessment & Plan Note (Addendum)
History of IBS diarrhea type as per HPI.  She has undergone colonoscopy with random colon biopsies which were essentially normal.  No red flag/warning signs or symptoms noticed today.  We will try her on Bentyl, Imodium, hyoscyamine without improvement.  Most these medications initially worked and then lose her effectiveness.  At this point I will trial her on Lomotil 2.5 mg 3 times a day before meals as she typically has symptoms after eating.  We will see if this helps head off diarrhea after eating.  I have also told her she can use Imodium as needed to help with additional diarrhea control.  I strongly suggested she call us in 2 weeks with a progress report so we can make other adjustments as needed.  We could potentially increase her Lomotil to a fourth dose for maximum 20 mg/day.  Return for follow-up in 6 months.

## 2019-09-05 NOTE — Patient Instructions (Addendum)
Your health issues we discussed today were:   Worsening/new mid abdominal pain for the known hernia: 1. I have put in an order to check a CT of your abdomen for worsening hernia 2. Somebody from radiology or our office will call to schedule your CT scan 3. Further recommendations will follow your CT scan 4. Let us know if you have any worsening or severe symptoms.  If we are not open and your symptoms worsen significantly you can proceed to the emergency department  Diarrhea with known history of irritable bowel syndrome-diarrhea type: 1. I have sent in a prescription for Lomotil 2.5 mg.  Take this 3 times a day, about 15 to 20 minutes before eating to see if that helps improve your diarrhea that typically occurs after eating 2. You can also use Imodium as needed for diarrhea 3. Call us in 2 weeks and let us know if it is helping 4. We may be able to increase her dose somewhat depending on how you do 5. Call us for any worsening or severe symptoms or for any blood in your stools  Overall I recommend:  1. Continue your other current medications 2. Return for follow-up in 6 months 3. Call us if you have any questions or concerns.   Because of recent events of COVID-19 ("Coronavirus"), follow CDC recommendations:  Wash your hand frequently Avoid touching your face Stay away from people who are sick If you have symptoms such as fever, cough, shortness of breath then call your healthcare provider for further guidance If you are sick, STAY AT HOME unless otherwise directed by your healthcare provider. Follow directions from state and national officials regarding staying safe   At [facility] we value your feedback. You may receive a survey about your visit today. Please share your experience as we strive to create trusting relationships with our patients to provide genuine, compassionate, quality care.  We appreciate your understanding and patience as we review any  laboratory studies, imaging, and other diagnostic tests that are ordered as we care for you. Our office policy is 5 business days for review of these results, and any emergent or urgent results are addressed in a timely manner for your best interest. If you do not hear from our office in 1 week, please contact us.   We also encourage the use of MyChart, which contains your medical information for your review as well. If you are not enrolled in this feature, an access code is on this after visit summary for your convenience. Thank you for allowing [place] to be involved in your care.  It was great to see you today!  I hope you have a Happy New Year!!

## 2019-09-06 NOTE — Telephone Encounter (Signed)
PA approved. Auth# [id number] dates 09/05/2019-03/03/2020  CT scheduled for 1/27 at 10:00am, arrival 9:45am, npo 4 hrs prior, p/u oral contrast.  Called pt and is aware of appt details. Letter mailed.

## 2019-09-20 ENCOUNTER — Ambulatory Visit (HOSPITAL_COMMUNITY): Admission: RE | Admit: 2019-09-20 | Payer: Medicaid Other | Source: Ambulatory Visit

## 2019-10-02 ENCOUNTER — Ambulatory Visit (HOSPITAL_COMMUNITY): Payer: Medicaid Other

## 2019-10-04 ENCOUNTER — Ambulatory Visit (HOSPITAL_COMMUNITY): Admission: RE | Admit: 2019-10-04 | Payer: Medicaid Other | Source: Ambulatory Visit

## 2020-03-05 ENCOUNTER — Encounter: Payer: Self-pay | Admitting: Nurse Practitioner

## 2020-03-05 ENCOUNTER — Ambulatory Visit: Payer: Medicaid Other | Admitting: Nurse Practitioner

## 2020-03-05 ENCOUNTER — Other Ambulatory Visit: Payer: Self-pay

## 2020-03-05 ENCOUNTER — Telehealth: Payer: Self-pay | Admitting: *Deleted

## 2020-03-05 VITALS — BP 126/81 | HR 74 | Temp 97.9°F | Ht 68.0 in | Wt 281.8 lb

## 2020-03-05 DIAGNOSIS — R197 Diarrhea, unspecified: Secondary | ICD-10-CM

## 2020-03-05 DIAGNOSIS — R109 Unspecified abdominal pain: Secondary | ICD-10-CM | POA: Diagnosis not present

## 2020-03-05 MED ORDER — CHOLESTYRAMINE 4 G PO PACK: 4.0000 g | PACK | Freq: Every day | ORAL | 12 refills | Status: DC

## 2020-03-05 NOTE — Telephone Encounter (Signed)
PA submitted via NIA. PA pending clinical review. Clinicals faxed.

## 2020-03-05 NOTE — Patient Instructions (Addendum)
Your health issues we discussed today were:   Diarrhea: 1. As we discussed, your diarrhea may be due to having your gallbladder out 2. I sent a prescription for cholestyramine (Questran) to your pharmacy 3. Starting out, take 4 g of cholestyramine once a day. 4. It is important that you take your other medications at least 1 hour before Questran, or 4 to 6 hours after Questran 5. Call us with a progress report in 3 to 4 weeks. Depending how you do with the medication, we may need to increase your dose frequency to twice a day or even 3 times a day.  Abdominal pain: 1. As we discussed, I will try to have your prior offer your CT scan reapproved and your scan scheduled 2. Further recommendations will follow 3. Call for any worsening or severe symptoms  Overall I recommend:  1. Continue your other current medications 2. Return for follow-up in 2 months 3. Call us if you have any questions or concerns   At [facility] we value your feedback. You may receive a survey about your visit today. Please share your experience as we strive to create trusting relationships with our patients to provide genuine, compassionate, quality care.  We appreciate your understanding and patience as we review any laboratory studies, imaging, and other diagnostic tests that are ordered as we care for you. Our office policy is 5 business days for review of these results, and any emergent or urgent results are addressed in a timely manner for your best interest. If you do not hear from our office in 1 week, please contact us.   We also encourage the use of MyChart, which contains your medical information for your review as well. If you are not enrolled in this feature, an access code is on this after visit summary for your convenience. Thank you for allowing [place] to be involved in your care.  It was great to see you today!  I hope you have a great Summer!!

## 2020-03-05 NOTE — Progress Notes (Signed)
Referring Provider: Medicine, [facility] Internal Primary Care Physician:  Medicine, [facility] Internal Primary GI:  Dr. Gala Romney  Chief Complaint  Patient presents with   Diarrhea    daily; 3-4 times after each meal   Abdominal Pain    HPI:   Sharon Macdonald is a 54 y.o. female who presents for follow-up on abdominal pain and IBS-D.  The patient was last seen in our office on 08/12/2020 for abdominal pain and diarrhea.  Her last visit was a virtual office visit due to COVID-19/coronavirus pandemic.  Noted history of IBS-D, previously tried and failed Bentyl, Imodium made her "sick to her stomach".  Colonoscopy on file 05/08/2019 essentially normal status post random colon biopsies found to have no significant histopathological changes and recommended repeat in 10 years.  Stool studies all negative.  Noted status post cholecystectomy.  Was trialed on hyoscyamine.  At her last visit still with diarrhea, typically postprandial and noted urgency and incontinence.  Lower abdominal crampy pain that improves after bowel movement.  Not taking the hyoscyamine as previously recommended stating "it worked for 2 days and quit" although it should be noted she did not call with a progress report as requested.  Currently takes Kaopectate for diarrhea which improves her symptoms somewhat.  Typically stools 5 minutes after eating.  Abdominal hernia aggravated by Kaopectate has not seen the surgeon despite progressive worsening.  No other overt GI complaints.  Recommended CT of the abdomen to evaluate hernia, trial of Lomotil 2.5 mg 3 times a day about 15 to 20 minutes before eating, Imodium as needed, progress report in 2 weeks, follow-up in 6 months.  No progress report was called our office.  It does not appear that CT scan was completed, although it was scheduled.  Today she states she's doing ok overall. She is taking the lomotil three times a day before she eats; this hasn't helped. Still with diarrhea 3-4 times a  day, typically postprandial. She has had her gallbladder removed when she was 54 years old; feels her diarrhea may have started the summer after her gallbladder surgery. Still with lower abdominal pain, crampy, improves after a bowel movement. Hernia still bothering her. She states radiology never scheduled her CT, she called them and was told they don't anything about it. Denies N/V, hematochezia, melena, fever, chills, unintentional weight loss. Denies URI or flu-like symptoms. Denies loss of sense of taste or smell. The patient has not received COVID-19 vaccination(s). They are not interested in scheduling information at this time. Denies chest pain, dyspnea, dizziness, lightheadedness, syncope, near syncope. Denies any other upper or lower GI symptoms.   Past Medical History:  Diagnosis Date   Anxiety 07/13/2017   IBS (irritable bowel syndrome)    Restless leg syndrome     Past Surgical History:  Procedure Laterality Date   APPENDECTOMY     BIOPSY  05/08/2019   Procedure: BIOPSY;  Surgeon: Daneil Dolin, MD;  [facility];  Service: Endoscopy;;   CHOLECYSTECTOMY     COLONOSCOPY WITH PROPOFOL N/A 05/08/2019   Procedure: COLONOSCOPY WITH PROPOFOL;  Surgeon: Daneil Dolin, MD;  [facility];  Service: Endoscopy;  Laterality: N/A;  1:45pm-office moved up to 10:15am    Current Outpatient Medications  Medication Sig Dispense Refill   busPIRone (BUSPAR) 15 MG tablet Take 15 mg by mouth 2 (two) times daily as needed (anxiety).      diphenoxylate-atropine (LOMOTIL) 2.5-0.025 MG tablet Take 1 tablet by mouth 3 (  three) times daily before meals. 90 tablet 3   escitalopram (LEXAPRO) 20 MG tablet Take 20 mg by mouth daily.     gabapentin (NEURONTIN) 300 MG capsule Take 300 mg by mouth at bedtime as needed.      ondansetron (ZOFRAN) 4 MG tablet Take 1 tablet (4 mg total) by mouth every 8 (eight) hours as needed for nausea or vomiting. 12 tablet 0   No current  facility-administered medications for this visit.    Allergies as of 03/05/2020   (No Known Allergies)    Family History  Problem Relation Age of Onset   Cancer Mother    Colon cancer Neg Hx     Social History   Socioeconomic History   Marital status: Married    Spouse name: Not on file   Number of children: Not on file   Years of education: Not on file   Highest education level: Not on file  Occupational History   Not on file  Tobacco Use   Smoking status: Current Every Day Smoker    Types: E-cigarettes   Smokeless tobacco: Never Used   Tobacco comment: e-ciggarettes daily  Substance and Sexual Activity   Alcohol use: No   Drug use: No   Sexual activity: Yes    Birth control/protection: Surgical  Other Topics Concern   Not on file  Social History Narrative   Not on file   Social Determinants of Health   Financial Resource Strain:    Difficulty of Paying Living Expenses:   Food Insecurity:    Worried About Charity fundraiser in the Last Year:    Arboriculturist in the Last Year:   Transportation Needs:    Film/video editor (Medical):    Lack of Transportation (Non-Medical):   Physical Activity:    Days of Exercise per Week:    Minutes of Exercise per Session:   Stress:    Feeling of Stress :   Social Connections:    Frequency of Communication with Friends and Family:    Frequency of Social Gatherings with Friends and Family:    Attends Religious Services:    Active Member of Clubs or Organizations:    Attends Music therapist:    Marital Status:     Subjective: Review of Systems  Constitutional: Negative for chills, fever, malaise/fatigue and weight loss.  HENT: Negative for congestion and sore throat.   Respiratory: Negative for cough and shortness of breath.   Cardiovascular: Negative for chest pain and palpitations.  Gastrointestinal: Positive for abdominal pain and diarrhea. Negative for blood in  stool, melena, nausea and vomiting.  Musculoskeletal: Negative for joint pain and myalgias.  Skin: Negative for rash.  Neurological: Negative for dizziness and weakness.  Endo/Heme/Allergies: Does not bruise/bleed easily.  Psychiatric/Behavioral: Negative for depression. The patient is not nervous/anxious.   All other systems reviewed and are negative.    Objective: BP 126/81    Pulse 74    Temp 97.9 F (36.6 C) (Oral)    Ht 5\' 8"  (1.727 m)    Wt 281 lb 12.8 oz (127.8 kg)    LMP 08/30/2015 (Approximate)    BMI 42.85 kg/m  Physical Exam Vitals and nursing note reviewed.  Constitutional:      General: She is not in acute distress.    Appearance: Normal appearance. She is well-developed. She is obese. She is not ill-appearing, toxic-appearing or diaphoretic.  HENT:     Head: Normocephalic and atraumatic.  Nose: No congestion or rhinorrhea.  Eyes:     General: No scleral icterus. Cardiovascular:     Rate and Rhythm: Normal rate and regular rhythm.     Heart sounds: Normal heart sounds.  Pulmonary:     Effort: Pulmonary effort is normal. No respiratory distress.     Breath sounds: Normal breath sounds.  Abdominal:     General: Bowel sounds are normal.     Palpations: Abdomen is soft. There is no hepatomegaly, splenomegaly or mass.     Tenderness: There is abdominal tenderness in the right lower quadrant, suprapubic area and left lower quadrant. There is no guarding or rebound.     Hernia: No hernia is present.  Skin:    General: Skin is warm and dry.     Coloration: Skin is not jaundiced.     Findings: No rash.  Neurological:     General: No focal deficit present.     Mental Status: She is alert and oriented to person, place, and time.  Psychiatric:        Attention and Perception: Attention normal.        Mood and Affect: Mood normal.        Speech: Speech normal.        Behavior: Behavior normal.        Thought Content: Thought content normal.        Cognition and  Memory: Cognition and memory normal.       03/05/2020 11:17 AM   Disclaimer: This note was dictated with voice recognition software. Similar sounding words can inadvertently be transcribed and may not be corrected upon review.

## 2020-03-05 NOTE — Assessment & Plan Note (Signed)
Persistent diarrhea. Hyoscyamine and Lomotil have not helped. Imodium effective. Bentyl caused adverse effect. Of note, she is status post cholecystectomy when she was about 54 years old and thinks her diarrhea may have started around that time. I will try Questran to see if we can help her have more normalized stools. Stop Lomotil for now. Follow-up in 2 months. Call for any worsening symptoms.

## 2020-03-08 NOTE — Telephone Encounter (Signed)
PA approved for CT 03/05/2020-04/04/2020 via [facility]

## 2020-03-13 NOTE — Telephone Encounter (Signed)
LMTCB

## 2020-04-15 ENCOUNTER — Telehealth: Payer: Self-pay | Admitting: Internal Medicine

## 2020-04-15 ENCOUNTER — Telehealth: Payer: Self-pay | Admitting: *Deleted

## 2020-04-15 DIAGNOSIS — R197 Diarrhea, unspecified: Secondary | ICD-10-CM

## 2020-04-15 DIAGNOSIS — R109 Unspecified abdominal pain: Secondary | ICD-10-CM

## 2020-04-15 NOTE — Telephone Encounter (Signed)
Back off to a bland diet. Has she tried Imodium? Any exposure to antibiotics recently?

## 2020-04-15 NOTE — Telephone Encounter (Signed)
Called Barbaraann Rondo (fiance) back.  He informed me that pt missed a dose of medication (Cholestyramine) on Tues or Wed.  He said that pt has not been right since.  She was up all night last night with diarrhea from 10 until 5 this morning.  He said that she has been to the bathroom at least 6 or more times.  He said that she is complaining that her stomach feels swollen.  He states that she is able to eat and drink good.  Can we recommend something to help her get relief?  Routing to Roseanne Kaufman, NP in [place], NP's absence.

## 2020-04-15 NOTE — Telephone Encounter (Signed)
See other note

## 2020-04-15 NOTE — Telephone Encounter (Signed)
Called Barbaraann Rondo back and routed note to Roseanne Kaufman, NP.  See other note.

## 2020-04-15 NOTE — Telephone Encounter (Signed)
PATIENT RETURNED CALL, PLEASE CALL BACK  °

## 2020-04-15 NOTE — Telephone Encounter (Signed)
If she has Levsin, use this as needed every 4 hours. Further rec's per Randall Hiss tomorrow. May need stool studies if acute on chronic diarrhea.

## 2020-04-15 NOTE — Telephone Encounter (Signed)
Patient spouse states patient not able to take imodium because it "blocks" her up. That he is aware of no exposure to antibiotics recently.

## 2020-04-15 NOTE — Telephone Encounter (Signed)
[phone] PATIENT FIANCE RODNEY CALLED ABOUT PATIENT, SHE MISSED ONE DOES OF HER MEDICATION AND HER STOMACH HAS BEEN MESSED UP SINCE

## 2020-04-16 NOTE — Telephone Encounter (Signed)
Called pt and spoke to [place].  He was informed that if pt has Levsin, use this as needed every 4 hours. Informed him that Randall Hiss to review as well.   He was informed that she may need stool studies if acute on chronic diarrhea. He voiced understanding and is aware that we will be back in touch.

## 2020-04-17 NOTE — Addendum Note (Signed)
Addended by: Gordy Levan, Samaad Hashem A on: 04/17/2020 09:57 AM   Modules accepted: Orders

## 2020-04-17 NOTE — Telephone Encounter (Signed)
Called pt and was advised to speak with Barbaraann Rondo.  Informed him that it appears to be acute on chronic diarrhea. Informed him that Walden Field, NP says we will add stool studies. Informed to continue with Levsin. Further recommendations to follow stool studies.  He voiced understanding.  Informed him that pt needs to get collection kit from the lab at [place].  He said that he would go on Tues because he will be going out of town and he does all of her running for her.

## 2020-04-17 NOTE — Telephone Encounter (Signed)
Yes, appears to be acute on chronic diarrhea. Will add stool studies. Continue with Levsin. Further recommendations to follow stool studies  Please have patient get collection kit from the lab. 

## 2020-05-08 ENCOUNTER — Ambulatory Visit: Payer: Medicaid Other | Admitting: Nurse Practitioner

## 2020-05-22 IMAGING — DX DG ELBOW COMPLETE 3+V*L*
4 series · 4 of 4 positions shown · non-contrast
Comparison: None.

CLINICAL DATA: Left elbow pain

EXAM:
LEFT ELBOW - COMPLETE 3+ VIEW

[elbow ap]
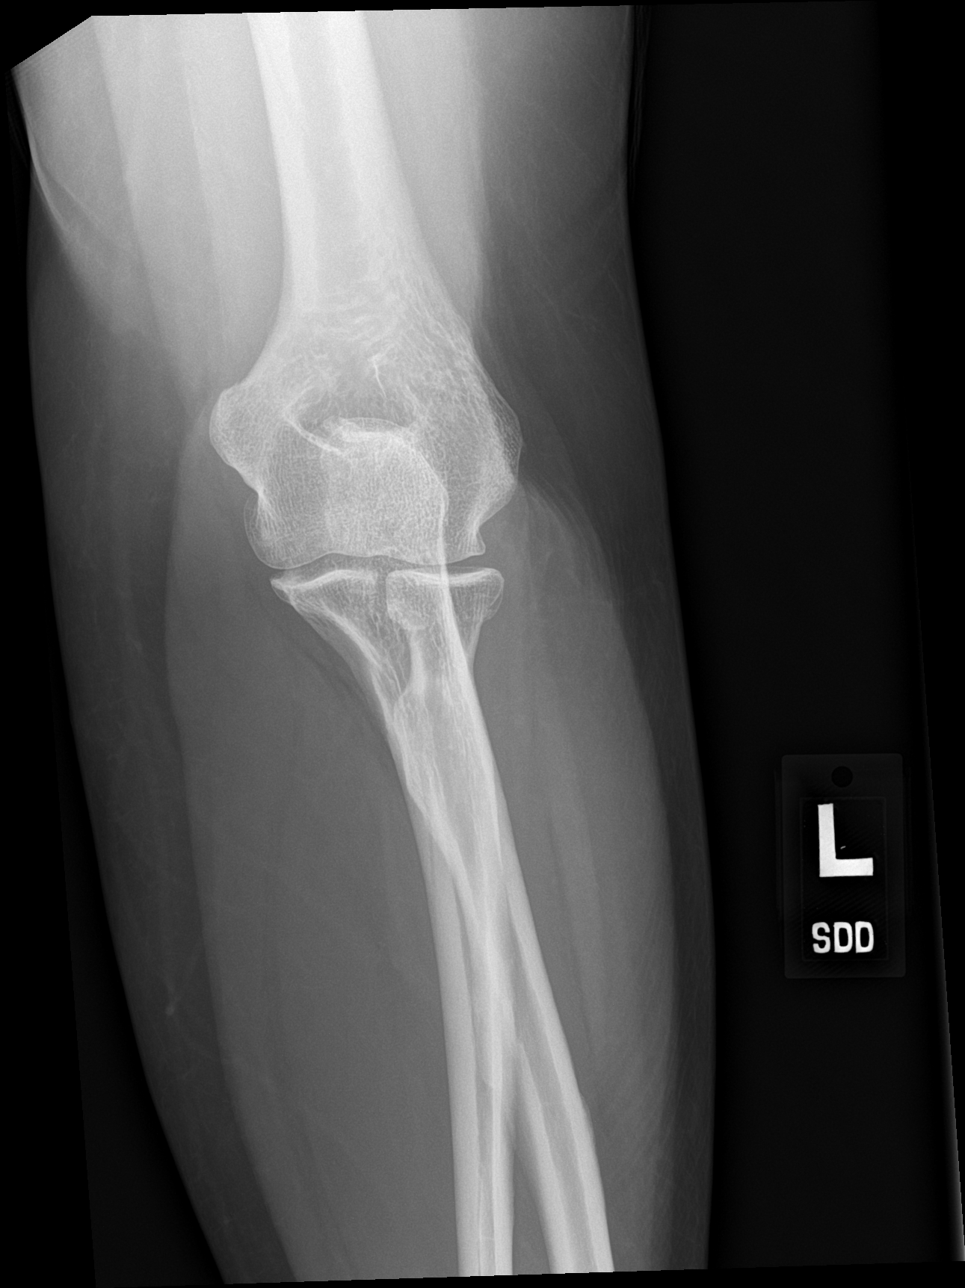

[elbow obl (1 of 2)]
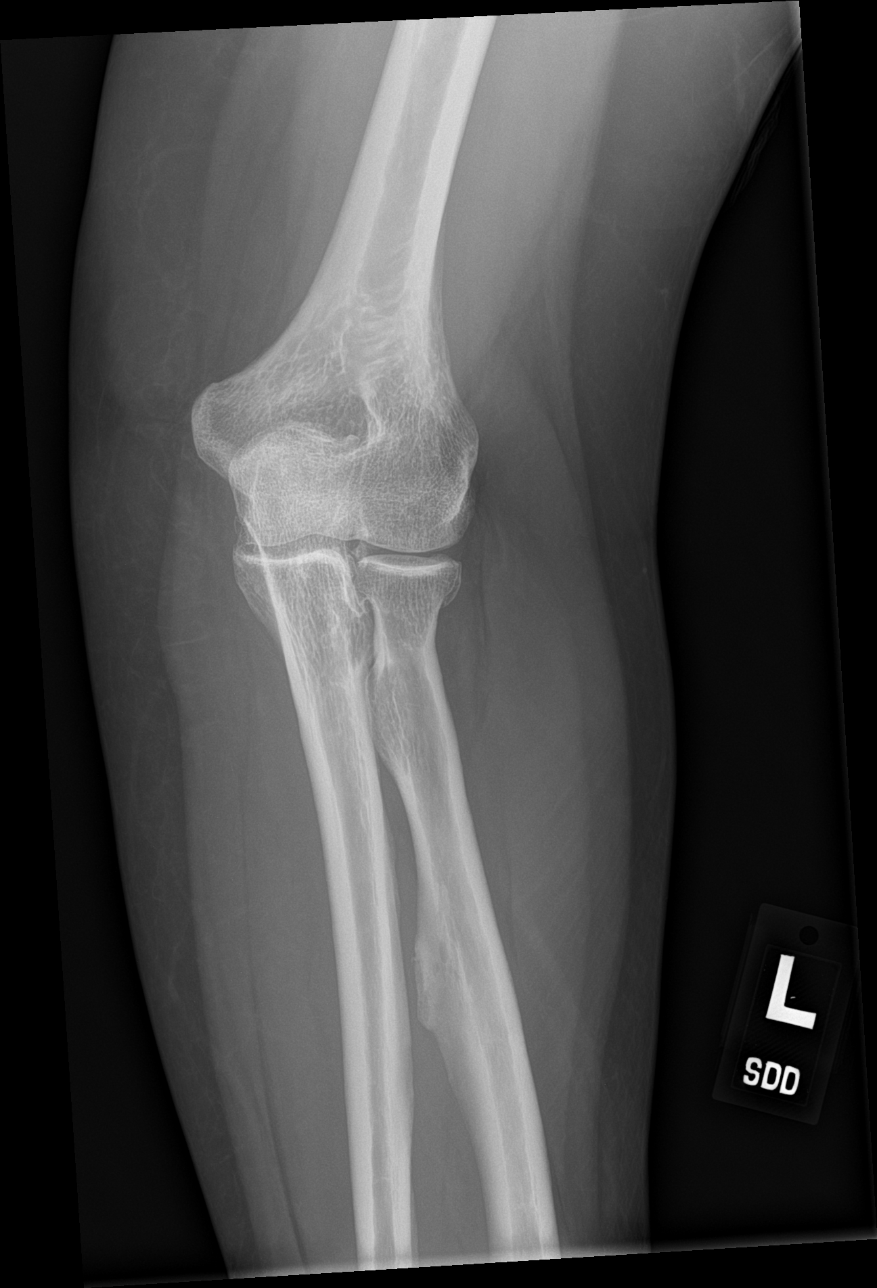

[elbow obl (2 of 2)]
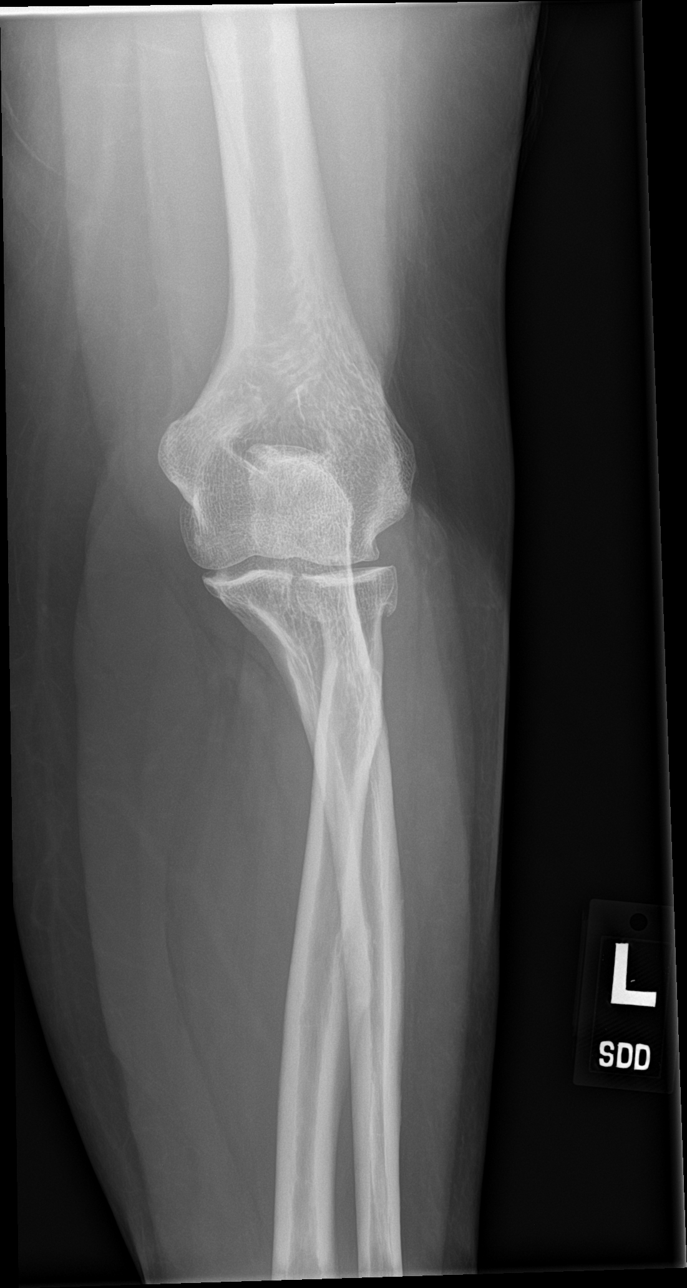

[elbow lat]
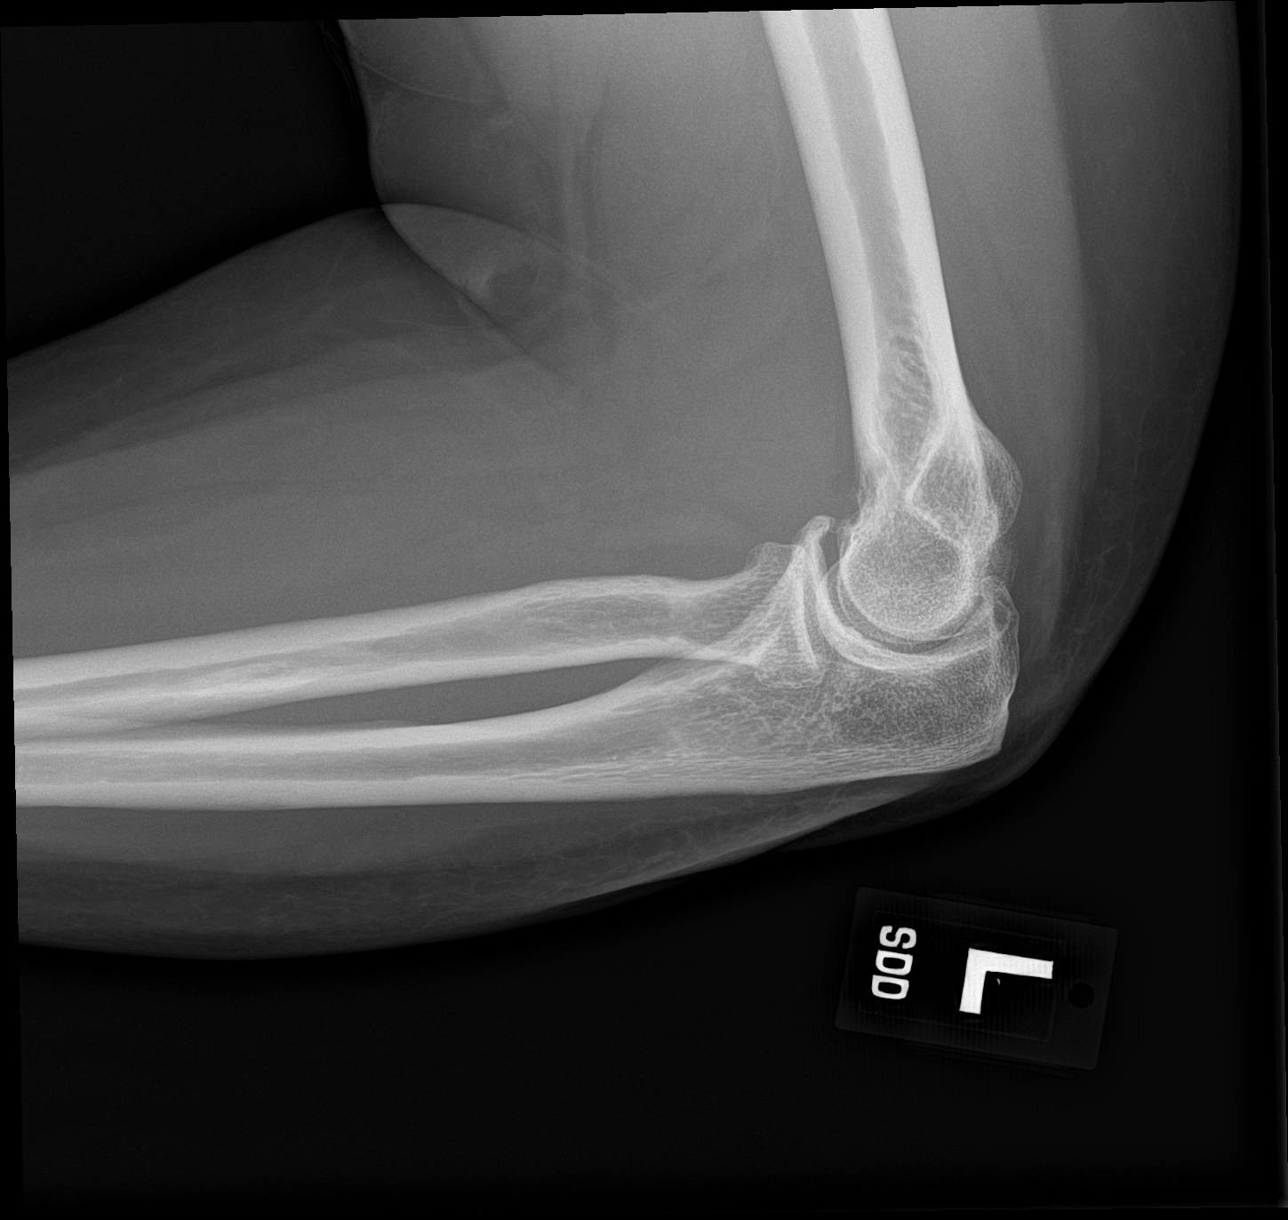

[4 of 4 positions shown; findings below may reference images not displayed]

FINDINGS: Degenerative changes with joint space narrowing and spurring. No
joint effusion. No acute bony abnormality. Specifically, no
fracture, subluxation, or dislocation.
IMPRESSION: No acute bony abnormality.

## 2020-08-20 ENCOUNTER — Telehealth: Payer: Self-pay | Admitting: Internal Medicine

## 2020-08-20 NOTE — Telephone Encounter (Signed)
[phone]  PLEASE CALL PATIENT ABOUT HER MEDICATION, SHE CAN TAKE IT FOR A WHILE THEN SHE HAS TO STOP A FEW DAYS BECAUSE IT MAKES HER SWELL AND BLOAT

## 2020-08-21 NOTE — Telephone Encounter (Signed)
Spoke with pt. Pt states that she takes the Questran powder to control her diarrhea. When Questran is taken, she feels bloated, abdomen swells and it's hard for her to eat. Pt will then skip 1-2 days of taking Questran powder to help with the swelling of her abdomen and feelings on not wanting to eat. Pt has diarrhea only on the days when she doesn't take Questran.

## 2020-08-21 NOTE — Telephone Encounter (Signed)
Try Taking Questran every other day, and on off days try Immodium to see if that improves the bloating and keeps diarrhea under control.

## 2020-08-21 NOTE — Telephone Encounter (Signed)
Noted. Spoke with pt. She was advised to try Questran Powder qod and take Imodium on the days she doesn't take Questran. Pt will call back if this regimen doesn't control her bloating and diarrhea.

## 2021-10-01 ENCOUNTER — Other Ambulatory Visit: Payer: Self-pay | Admitting: Internal Medicine

## 2021-10-01 DIAGNOSIS — Z1231 Encounter for screening mammogram for malignant neoplasm of breast: Secondary | ICD-10-CM

## 2022-04-04 DIAGNOSIS — E039 Hypothyroidism, unspecified: Secondary | ICD-10-CM | POA: Insufficient documentation

## 2022-04-04 DIAGNOSIS — F419 Anxiety disorder, unspecified: Secondary | ICD-10-CM | POA: Insufficient documentation

## 2022-04-05 DIAGNOSIS — N289 Disorder of kidney and ureter, unspecified: Secondary | ICD-10-CM | POA: Insufficient documentation

## 2022-04-08 DIAGNOSIS — K50119 Crohn's disease of large intestine with unspecified complications: Secondary | ICD-10-CM | POA: Insufficient documentation

## 2022-04-09 ENCOUNTER — Encounter: Payer: Self-pay | Admitting: Gastroenterology

## 2022-04-09 NOTE — Progress Notes (Unsigned)
GI Office Note    Referring Provider: Medicine, [facility] Primary Care Physician:  Monico Blitz, MD  Primary Gastroenterologist: Garfield Cornea, MD   Chief Complaint   Chief Complaint  Patient presents with   Hospitalization Follow-up    Was in hospital at [facility] for a twisted bowel, still having severe pain.     History of Present Illness   Sharon Macdonald is a 56 y.o. female presenting today for further evaluation of abdominal pain. Patient called and made her appointment yesterday after being release from [facility].   Last seen in our office in 02/2020 for follow up abdominal pain, IBS-D. Colonoscopy on file 05/08/2019 essentially normal status post random colon biopsies found to have no significant histopathological changes and recommended repeat in 10 years.   Recent admission (D/C'd on 04/09/22) for small bowel obstruction. Patient states she was doing relatively well from a GI standpoint.  Bowels typically fluctuate from diarrhea and normal stools at baseline.  Was able to regulate somewhat by eating raisin bran at night.  Last Friday night she developed acute onset vomiting and diarrhea after supper.  No one else in her family was sick.  Symptoms progressed throughout the evening and developed significant abdominal pain and vomiting Saturday which she cannot tolerate.  Cannot keep anything down.  Diarrhea did resolve.  She went to the ED for further evaluation.  While inpatient, she did not have any bowel movements until prior to discharge.    She had a CT without contrast on admission that showed small bowel obstruction with relative transition point within the anterior right pelvis.  She required NG tube placement.  She had a repeat CT 3 days later with contrast that showed distal ileum diffusely thickened and tethered appearing although now nondistended status post NG tube placement, findings consistent with nonspecific infectious, inflammatory, or ischemic enteritis.   Also noted to have masslike appearance of endometrial stripe and uterine fibroids concerning for malignancy.  Pelvic ultrasound showed probable subserosal leiomyoma of the anterior uterine wall, plans for outpatient follow-up with GYN.  Initially her total bilirubin was 2.0, down to 1.4.  Alk phos was mildly elevated at 149.  Transaminases normal.  Hemoglobin 11.3 slightly low.  Discharged yesterday.  States she is now having 10-15 watery stools per day.  Consuming red Jell-O, she is not sure if she is got blood in her stool or not.  Denies melena.  No fever.  Stomach pain feels like labor pains.  When she has abdominal pain she tends to have a BM shortly afterwards.  She used to use Imodium for chronic diarrhea.  She was told never to use Imodium again given her bowel obstruction.  She was discharged from the hospital on Cipro and Flagyl but PCP asked her to stop taking it stating that likely making her diarrhea worse. Levsin tid gave yesterday.   Labs from 04/09/22: WBC 6500, Hgb 11.3L, Hct 34.1, MCV 90.5, platelets 167000, BUN 6, cre 0.81. Labs from 04/04/22: lipase 82, Tbili 1.4H, AP 149H, AST 28, ALT 34. 04/05/22 Tbili 2.0H, white blood cell count 22,200, hemoglobin 15.5.  US pelvis 04/07/22: probable subserosal leiomyoma at anterior uterine wall 2.5cm size.   CT A/P with contrast 04/07/22:  CT Abdomen Pelvis W Contrast  1. Previously seen mild distention of the small bowel is resolved  following placement of esophagogastric tube.  2. The distal ileum is diffusely thickened and tethered appearing,  although now nondistended. Findings are consistent with nonspecific  infectious, inflammatory, or ischemic enteritis. No specific  stigmata to suggest inflammatory bowel disease such as Crohn's  disease at this time, with relatively uninvolved terminal ileum and  cecum.  3. Heterogeneously hypoenhancing, masslike appearance of the  endometrial stripe and uterine fundus concerning for malignancy.   Recommend dedicated pelvic ultrasound for further evaluation, which  can be performed on a nonemergent, outpatient basis.  4. Hepatomegaly and hepatic steatosis.   Aortic Atherosclerosis (ICD10-I70.0).    CT A/P without contrast 04/04/22: Impression  1. Small bowel obstruction with relative transition point within the  anterior RIGHT pelvis. No definite obstructing cause identified. No  evidence of pneumoperitoneum or focal collection.  2. Hepatic steatosis.  3. Aortic Atherosclerosis (ICD10-I70.0).   Medications   Current Outpatient Medications  Medication Sig Dispense Refill   busPIRone (BUSPAR) 15 MG tablet Take 15 mg by mouth 2 (two) times daily as needed (anxiety).      cyclobenzaprine (FLEXERIL) 10 MG tablet Take 10 mg by mouth at bedtime.     escitalopram (LEXAPRO) 20 MG tablet Take 20 mg by mouth daily.     folic acid (FOLVITE) 1 MG tablet Take 1 mg by mouth daily.     gabapentin (NEURONTIN) 300 MG capsule Take 300 mg by mouth at bedtime as needed.      hyoscyamine (LEVSIN) 0.125 MG tablet Take by mouth.     levothyroxine (SYNTHROID) 137 MCG tablet Take 137 mcg by mouth every morning.     ondansetron (ZOFRAN) 4 MG tablet Take 1 tablet (4 mg total) by mouth every 8 (eight) hours as needed for nausea or vomiting. 12 tablet 0   pramipexole (MIRAPEX) 0.75 MG tablet Take 0.75 mg by mouth at bedtime.     Vitamin D, Ergocalciferol, (DRISDOL) 1.25 MG (50000 UNIT) CAPS capsule Take 50,000 Units by mouth 2 (two) times a week.     No current facility-administered medications for this visit.    Allergies   Allergies as of 04/10/2022   (No Known Allergies)     Past Medical History   Past Medical History:  Diagnosis Date   Anxiety 07/13/2017   IBS (irritable bowel syndrome)    Restless leg syndrome    SBO (small bowel obstruction) (HCC)    Thyroid disease    Uterine leiomyoma     Past Surgical History   Past Surgical History:  Procedure Laterality Date    APPENDECTOMY     BIOPSY  05/08/2019   Procedure: BIOPSY;  Surgeon: Daneil Dolin, MD;  [facility];  Service: Endoscopy;;   CHOLECYSTECTOMY     COLONOSCOPY WITH PROPOFOL N/A 05/08/2019   Procedure: COLONOSCOPY WITH PROPOFOL;  Surgeon: Daneil Dolin, MD;  [facility];  Service: Endoscopy;  Laterality: N/A;  1:45pm-office moved up to 10:15am    Past Family History   Family History  Problem Relation Age of Onset   Cancer Mother    Colon cancer Neg Hx     Past Social History   Social History   Socioeconomic History   Marital status: Married    Spouse name: Not on file   Number of children: Not on file   Years of education: Not on file   Highest education level: Not on file  Occupational History   Not on file  Tobacco Use   Smoking status: Every Day    Types: E-cigarettes   Smokeless tobacco: Never   Tobacco comments:    e-ciggarettes daily  Substance and Sexual Activity  Alcohol use: No   Drug use: No   Sexual activity: Yes    Birth control/protection: Surgical  Other Topics Concern   Not on file  Social History Narrative   Not on file   Social Determinants of Health   Financial Resource Strain: Not on file  Food Insecurity: Not on file  Transportation Needs: Not on file  Physical Activity: Not on file  Stress: Not on file  Social Connections: Not on file  Intimate Partner Violence: Not on file    Review of Systems   General: Negative for anorexia, weight loss, fever, chills, fatigue, weakness. ENT: Negative for hoarseness, difficulty swallowing , nasal congestion. CV: Negative for chest pain, angina, palpitations, dyspnea on exertion, peripheral edema.  Respiratory: Negative for dyspnea at rest, dyspnea on exertion, cough, sputum, wheezing.  GI: See history of present illness. GU:  Negative for dysuria, hematuria, urinary incontinence, urinary frequency, nocturnal urination.  Endo: Negative for unusual weight change.      Physical Exam   BP 121/80 (BP Location: Left Arm, Patient Position: Sitting, Cuff Size: Large)   Pulse 80   Temp 97.8 F (36.6 C) (Temporal)   Ht '5\' 8"'  (1.727 m)   Wt 289 lb 9.6 oz (131.4 kg)   LMP 08/30/2015 (Approximate)   SpO2 96%   BMI 44.03 kg/m    General: Well-nourished, well-developed in no acute distress.  Eyes: No icterus. Mouth: Oropharyngeal mucosa moist and pink , no lesions erythema or exudate. Lungs: Clear to auscultation bilaterally.  Heart: Regular rate and rhythm, no murmurs rubs or gallops.  Abdomen: Bowel sounds are normal,   nondistended, no hepatosplenomegaly or masses,  no abdominal bruits or hernia , no rebound or guarding.  Slight diffuse tenderness to palpation Rectal: Not performed Extremities: No lower extremity edema. No clubbing or deformities. Neuro: Alert and oriented x 4   Skin: Warm and dry, no jaundice.   Psych: Alert and cooperative, normal mood and affect.  Labs   See HPI Imaging Studies   No results found.  Assessment   Abdominal pain: Baseline IBS-D.  Was doing fairly well until acute onset vomiting/diarrhea/abdominal pain requiring admission.  CT imaging initially showing small bowel obstruction with transition point in the anterior right pelvis.  Follow-up CT with contrast showing abnormal distal ileum with relatively uninvolved terminal ileum/cecum, suspected inflammatory, infectious, or ischemic process although no specific stigmata to suggest Crohn's disease.  Since discharge continues to have colicky type abdominal pain associated with diarrhea.  Having 10-15 stools per day.  No further vomiting.  Stool studies have not been performed and would suggest completing at this time.  Continue to avoid Imodium.  Limited use of hyoscyamine as needed for abdominal cramping monitoring stools closely.  Given acute onset of symptoms, infectious process may be source of abnormal ileum although with small bowel obstruction cannot exclude ischemic  process involved as well.  May ultimately need colonoscopy with ileoscopy.   PLAN   Continue to hold Cipro and Flagyl for now. Send stool studies. May use hyoscyamine up to 4 times daily for abdominal cramping.  Hold if constipation or if she goes more than 24 hours without a bowel movement.     Laureen Ochs. Bobby Rumpf, [place], [facility]

## 2022-04-10 ENCOUNTER — Ambulatory Visit (INDEPENDENT_AMBULATORY_CARE_PROVIDER_SITE_OTHER): Payer: Medicaid Other | Admitting: Gastroenterology

## 2022-04-10 ENCOUNTER — Encounter: Payer: Self-pay | Admitting: Gastroenterology

## 2022-04-10 VITALS — BP 121/80 | HR 80 | Temp 97.8°F | Ht 68.0 in | Wt 289.6 lb

## 2022-04-10 DIAGNOSIS — R1084 Generalized abdominal pain: Secondary | ICD-10-CM | POA: Diagnosis not present

## 2022-04-10 DIAGNOSIS — A09 Infectious gastroenteritis and colitis, unspecified: Secondary | ICD-10-CM | POA: Diagnosis not present

## 2022-04-10 MED ORDER — HYOSCYAMINE SULFATE 0.125 MG PO TABS: 0.1250 mg | ORAL_TABLET | Freq: Four times a day (QID) | ORAL | 0 refills | Status: DC | PRN

## 2022-04-10 NOTE — Patient Instructions (Signed)
Please collect your stool for testing as soon as possible.  Continue hyoscyamine up to four times daily for abdominal cramping. Hold if constipation or go more than 24 hours without a bowel movement.

## 2022-06-06 LAB — GI PROFILE, STOOL, PCR

## 2022-07-22 ENCOUNTER — Ambulatory Visit: Payer: Medicaid Other | Admitting: Gastroenterology

## 2022-07-28 ENCOUNTER — Ambulatory Visit: Payer: Medicaid Other | Admitting: Gastroenterology

## 2022-12-29 DIAGNOSIS — Z6841 Body Mass Index (BMI) 40.0 and over, adult: Secondary | ICD-10-CM | POA: Insufficient documentation

## 2023-01-10 NOTE — Progress Notes (Deleted)
GI Office Note    Referring Provider: Kirstie Peri, MD Primary Care Physician:  Kirstie Peri, MD  Primary Gastroenterologist: Roetta Sessions, MD   Chief Complaint   No chief complaint on file.   History of Present Illness   Sharon Macdonald is a 57 y.o. female presenting today for hospital follow up.  Last seen 03/2022.   Baseline IBS-D.    Recent admission at [facility] earlier this month for partial small bowel obstruction with CT showing transition at the ileum where there is likely edematous wall thickening. Similar hospitalization in 03/2022.   Labs uncr***  Colonoscopy on file 05/08/2019 essentially normal status post random colon biopsies found to have no significant histopathological changes and recommended repeat in 10 years.    Medications   Current Outpatient Medications  Medication Sig Dispense Refill   busPIRone (BUSPAR) 15 MG tablet Take 15 mg by mouth 2 (two) times daily as needed (anxiety).      cyclobenzaprine (FLEXERIL) 10 MG tablet Take 10 mg by mouth at bedtime.     escitalopram (LEXAPRO) 20 MG tablet Take 20 mg by mouth daily.     folic acid (FOLVITE) 1 MG tablet Take 1 mg by mouth daily.     gabapentin (NEURONTIN) 300 MG capsule Take 300 mg by mouth at bedtime as needed.      hyoscyamine (LEVSIN) 0.125 MG tablet Take 1 tablet (0.125 mg total) by mouth every 6 (six) hours as needed for cramping (Hold if you have constipation or go more than 24 hours without a bowel movement.). 60 tablet 0   levothyroxine (SYNTHROID) 137 MCG tablet Take 137 mcg by mouth every morning.     ondansetron (ZOFRAN) 4 MG tablet Take 1 tablet (4 mg total) by mouth every 8 (eight) hours as needed for nausea or vomiting. 12 tablet 0   pramipexole (MIRAPEX) 0.75 MG tablet Take 0.75 mg by mouth at bedtime.     Vitamin D, Ergocalciferol, (DRISDOL) 1.25 MG (50000 UNIT) CAPS capsule Take 50,000 Units by mouth 2 (two) times a week.     No current facility-administered medications for this  visit.    Allergies   Allergies as of 01/11/2023   (No Known Allergies)     Past Medical History   Past Medical History:  Diagnosis Date   Anxiety 07/13/2017   IBS (irritable bowel syndrome)    Restless leg syndrome    SBO (small bowel obstruction) (HCC)    Thyroid disease    Uterine leiomyoma     Past Surgical History   Past Surgical History:  Procedure Laterality Date   APPENDECTOMY     BIOPSY  05/08/2019   Procedure: BIOPSY;  Surgeon: Corbin Ade, MD;  [facility];  Service: Endoscopy;;   CHOLECYSTECTOMY     COLONOSCOPY WITH PROPOFOL N/A 05/08/2019   Procedure: COLONOSCOPY WITH PROPOFOL;  Surgeon: Corbin Ade, MD;  [facility];  Service: Endoscopy;  Laterality: N/A;  1:45pm-office moved up to 10:15am    Past Family History   Family History  Problem Relation Age of Onset   Cancer Mother    Colon cancer Neg Hx     Past Social History   Social History   Socioeconomic History   Marital status: Married    Spouse name: Not on file   Number of children: Not on file   Years of education: Not on file   Highest education level: Not on file  Occupational History  Not on file  Tobacco Use   Smoking status: Every Day    Types: E-cigarettes   Smokeless tobacco: Never   Tobacco comments:    e-ciggarettes daily  Substance and Sexual Activity   Alcohol use: No   Drug use: No   Sexual activity: Yes    Birth control/protection: Surgical  Other Topics Concern   Not on file  Social History Narrative   Not on file   Social Determinants of Health   Financial Resource Strain: Not on file  Food Insecurity: Not on file  Transportation Needs: Not on file  Physical Activity: Not on file  Stress: Not on file  Social Connections: Not on file  Intimate Partner Violence: Not on file    Review of Systems   General: Negative for anorexia, weight loss, fever, chills, fatigue, weakness. ENT: Negative for hoarseness, difficulty  swallowing , nasal congestion. CV: Negative for chest pain, angina, palpitations, dyspnea on exertion, peripheral edema.  Respiratory: Negative for dyspnea at rest, dyspnea on exertion, cough, sputum, wheezing.  GI: See history of present illness. GU:  Negative for dysuria, hematuria, urinary incontinence, urinary frequency, nocturnal urination.  Endo: Negative for unusual weight change.     Physical Exam   LMP 08/30/2015 (Approximate)    General: Well-nourished, well-developed in no acute distress.  Eyes: No icterus. Mouth: Oropharyngeal mucosa moist and pink , no lesions erythema or exudate. Lungs: Clear to auscultation bilaterally.  Heart: Regular rate and rhythm, no murmurs rubs or gallops.  Abdomen: Bowel sounds are normal, nontender, nondistended, no hepatosplenomegaly or masses,  no abdominal bruits or hernia , no rebound or guarding.  Rectal: ***  Extremities: No lower extremity edema. No clubbing or deformities. Neuro: Alert and oriented x 4   Skin: Warm and dry, no jaundice.   Psych: Alert and cooperative, normal mood and affect.  Labs   *** Imaging Studies   No results found.  Assessment       PLAN   ***   Leanna Battles. Melvyn Neth, MHS, PA-C [facility]

## 2023-01-11 ENCOUNTER — Encounter: Payer: Self-pay | Admitting: Gastroenterology

## 2023-01-11 ENCOUNTER — Ambulatory Visit: Payer: Medicaid Other | Admitting: Gastroenterology

## 2023-07-13 ENCOUNTER — Ambulatory Visit: Payer: Medicaid Other | Admitting: Gastroenterology

## 2023-07-28 ENCOUNTER — Ambulatory Visit (INDEPENDENT_AMBULATORY_CARE_PROVIDER_SITE_OTHER): Payer: Medicaid Other | Admitting: Gastroenterology

## 2023-07-28 ENCOUNTER — Encounter: Payer: Self-pay | Admitting: Gastroenterology

## 2023-07-28 VITALS — BP 113/77 | HR 73 | Temp 98.6°F | Ht 68.0 in | Wt 284.4 lb

## 2023-07-28 DIAGNOSIS — R933 Abnormal findings on diagnostic imaging of other parts of digestive tract: Secondary | ICD-10-CM | POA: Diagnosis not present

## 2023-07-28 DIAGNOSIS — K529 Noninfective gastroenteritis and colitis, unspecified: Secondary | ICD-10-CM | POA: Diagnosis not present

## 2023-07-28 DIAGNOSIS — R197 Diarrhea, unspecified: Secondary | ICD-10-CM

## 2023-07-28 MED ORDER — HYOSCYAMINE SULFATE 0.125 MG PO TABS: 0.1250 mg | ORAL_TABLET | Freq: Four times a day (QID) | ORAL | 1 refills | Status: DC | PRN

## 2023-07-28 NOTE — Progress Notes (Signed)
GI Office Note    Referring Provider: Kirstie Peri, MD Primary Care Physician:  Kirstie Peri, MD  Primary Gastroenterologist: Roetta Sessions, MD   Chief Complaint   Chief Complaint  Patient presents with   Follow-up    Pt here for diarrhea after eating (every time she eats)    History of Present Illness   Sharon Macdonald is a 57 y.o. female presenting today for evaluation of diarrhea.  Last seen 03/2022. She has h/o abdominal pain, IBS-D. Colonoscopy on file 05/08/2019 essentially normal status post random colon biopsies found to have no significant histopathological changes and recommended repeat in 10 years.   Last year she was hospitalized with small bowel obstruction requiring NG tube placement. CT showed small bowel obstruction with relative transition point within the anterior right pelvis.  She required NG tube placement.  She had a repeat CT 3 days later with contrast that showed distal ileum diffusely thickened and tethered appearing although now nondistended status post NG tube placement, findings consistent with nonspecific infectious, inflammatory, or ischemic enteritis.  Also noted to have masslike appearance of endometrial stripe and uterine fibroids concerning for malignancy.  Pelvic ultrasound showed probable subserosal leiomyoma of the anterior uterine wall, plans for outpatient follow-up with GYN. Stool studies ordered, patient completed two months after her ov. We offered her follow up ov but she did not return.   Since her last office visit she was hospitalized again in May 2024 with small bowel obstruction requiring NG tube placement.  Treated conservatively.  CT showing partial small bowel obstruction with transition of the ileum where there is likely edematous wall thickening.  Follow-up abdominal x-ray with nonobstructive pattern.  Labs 12/2022: Magnesium 1.5, sodium 143, potassium 3.6, creatinine 0.72, white blood cell count 7000, hemoglobin 12, MCV 91.7,  platelets 176,000, sodium 139, potassium 3.2, creatinine 0.8, TSH 5.652, lipase 38, A1c 5.5, total bilirubin 1.9, alkaline phosphatase 98, AST 17, ALT 28.  CT A/P without contrast 12/2022: IMPRESSION:  Partial small bowel obstruction with transition at the ileum where  there is likely edematous wall thickening.    Today: Did not bring her medication list but denies any injectable medications, blood thinners, aspirin or NSAIDs.  She will call with an up-to-date medication list.  Complains of ongoing postprandial stools.  Rarely has solid stools, sometimes with "soup beans".  Typically has increased diarrhea with greasy foods, dairy products.  Tries to stay away from bread because it causes abdominal pain.  Typically has a bowel movement every time she felt something in her mouth.  She has been having this problem ever since she got her gallbladder out in the early 90s.  No melena, brbpr. Some lower abdominal discomfort sometimes above the umbilicus. Can't put weight on the sides of her abdomen because of pain.  She is able to lay on her sides but if she places her arms on her side she has pain.  She has some nocturnal bowel movements.  Would like a refill on her Levsin.  Notes that she is out of several of her medications, could not recall the name but will call with updated list. No heartburn. No n/v. Good appetite.      Wt Readings from Last 3 Encounters:  07/28/23 284 lb 6.4 oz (129 kg)  04/10/22 289 lb 9.6 oz (131.4 kg)  03/05/20 281 lb 12.8 oz (127.8 kg)   Medications   Current Outpatient Medications  Medication Sig Dispense Refill   busPIRone (BUSPAR) 15 MG  tablet Take 15 mg by mouth 2 (two) times daily as needed (anxiety).      cyanocobalamin (VITAMIN B12) 1000 MCG tablet Take 1,000 mcg by mouth daily.     levothyroxine (SYNTHROID) 137 MCG tablet Take 137 mcg by mouth every morning.     Vitamin D, Ergocalciferol, (DRISDOL) 1.25 MG (50000 UNIT) CAPS capsule Take 50,000 Units by mouth 2  (two) times a week.     cyclobenzaprine (FLEXERIL) 10 MG tablet Take 10 mg by mouth at bedtime. (Patient not taking: Reported on 07/28/2023)     escitalopram (LEXAPRO) 20 MG tablet Take 20 mg by mouth daily. (Patient not taking: Reported on 07/28/2023)     folic acid (FOLVITE) 1 MG tablet Take 1 mg by mouth daily. (Patient not taking: Reported on 07/28/2023)     gabapentin (NEURONTIN) 300 MG capsule Take 300 mg by mouth at bedtime as needed.  (Patient not taking: Reported on 07/28/2023)     hyoscyamine (LEVSIN) 0.125 MG tablet Take 1 tablet (0.125 mg total) by mouth every 6 (six) hours as needed for cramping (Hold if you have constipation or go more than 24 hours without a bowel movement.). (Patient not taking: Reported on 07/28/2023) 60 tablet 0   ondansetron (ZOFRAN) 4 MG tablet Take 1 tablet (4 mg total) by mouth every 8 (eight) hours as needed for nausea or vomiting. (Patient not taking: Reported on 07/28/2023) 12 tablet 0   pramipexole (MIRAPEX) 0.75 MG tablet Take 0.75 mg by mouth at bedtime. (Patient not taking: Reported on 07/28/2023)     No current facility-administered medications for this visit.    Allergies   Allergies as of 07/28/2023   (No Known Allergies)     Past Medical History   Past Medical History:  Diagnosis Date   Anxiety 07/13/2017   IBS (irritable bowel syndrome)    Restless leg syndrome    SBO (small bowel obstruction) (HCC)    Thyroid disease    Uterine leiomyoma     Past Surgical History   Past Surgical History:  Procedure Laterality Date   APPENDECTOMY     BIOPSY  05/08/2019   Procedure: BIOPSY;  Surgeon: Corbin Ade, MD;  [facility];  Service: Endoscopy;;   CHOLECYSTECTOMY     COLONOSCOPY WITH PROPOFOL N/A 05/08/2019   Procedure: COLONOSCOPY WITH PROPOFOL;  Surgeon: Corbin Ade, MD;  [facility];  Service: Endoscopy;  Laterality: N/A;  1:45pm-office moved up to 10:15am    Past Family History   Family History  Problem  Relation Age of Onset   Cancer Mother    Colon cancer Neg Hx     Past Social History   Social History   Socioeconomic History   Marital status: Married    Spouse name: Not on file   Number of children: Not on file   Years of education: Not on file   Highest education level: Not on file  Occupational History   Not on file  Tobacco Use   Smoking status: Every Day    Types: E-cigarettes   Smokeless tobacco: Never   Tobacco comments:    e-ciggarettes daily  Substance and Sexual Activity   Alcohol use: No   Drug use: No   Sexual activity: Yes    Birth control/protection: Surgical  Other Topics Concern   Not on file  Social History Narrative   Not on file   Social Determinants of Health   Financial Resource Strain: Medium Risk (12/29/2022)  Received from [facility], [facility]   Overall Financial Resource Strain (CARDIA)    Difficulty of Paying Living Expenses: Somewhat hard  Food Insecurity: No Food Insecurity (12/29/2022)   Received from [facility], [facility]   Hunger Vital Sign    Worried About Running Out of Food in the Last Year: Never true    Ran Out of Food in the Last Year: Never true  Transportation Needs: Unmet Transportation Needs (12/29/2022)   Received from [facility], [facility]   [facility] - Transportation    Lack of Transportation (Medical): Yes    Lack of Transportation (Non-Medical): Yes  Physical Activity: Not on file  Stress: Not on file  Social Connections: Not on file  Intimate Partner Violence: Not on file    Review of Systems   General: Negative for anorexia, weight loss, fever, chills, fatigue, weakness. ENT: Negative for hoarseness, difficulty swallowing , nasal congestion. CV: Negative for chest pain, angina, palpitations, dyspnea on exertion, peripheral edema.  Respiratory: Negative for dyspnea at rest, dyspnea on exertion, cough, sputum, wheezing.  GI: See history of present illness. GU:  Negative for  dysuria, hematuria, urinary incontinence, urinary frequency, nocturnal urination.  Endo: Negative for unusual weight change.     Physical Exam   BP 113/77   Pulse 73   Temp 98.6 F (37 C)   Ht 5\' 8"  (1.727 m)   Wt 284 lb 6.4 oz (129 kg)   LMP 08/30/2015 (Approximate)   BMI 43.24 kg/m    General: Well-nourished, well-developed in no acute distress.  Eyes: No icterus. Mouth: Oropharyngeal mucosa moist and pink  Lungs: Clear to auscultation bilaterally.  Heart: Regular rate and rhythm, no murmurs rubs or gallops.  Abdomen: Bowel sounds are normal, nontender, nondistended, no hepatosplenomegaly or masses,  no abdominal bruits or hernia , no rebound or guarding.  Rectal: not Performed Extremities: No lower extremity edema. No clubbing or deformities. Neuro: Alert and oriented x 4   Skin: Warm and dry, no jaundice.   Psych: Alert and cooperative, normal mood and affect.  Labs   See HPI Imaging Studies   No results found.  Assessment/Plan    Chronic diarrhea: Baseline history of IBS, worsening diarrhea since cholecystectomy in the early 90s.  Postprandial loose stools may be due to IBS plus or minus bile salt diarrhea however she has abnormal appearing ileum and multiple CTs therefore Crohn's needs to be ruled out.  History of recurrent small bowel obstruction with transition point at the ileum.  -colonoscopy with Dr. Jena Gauss. ASA 3. Ok for room 1/2.  I have discussed the risks, alternatives, benefits with regards to but not limited to the risk of reaction to medication, bleeding, infection, perforation and the patient is agreeable to proceed. Written consent to be obtained. -use levsin prn -patient to call with updated medication list     Sharon Macdonald. Melvyn Neth, MHS, PA-C [facility]

## 2023-07-28 NOTE — Patient Instructions (Addendum)
Rx for hyoscyamine sent to [facility]. You can take one up to 3-4 times daily as needed for diarrhea/abdominal cramping. Works best if you take 30 minutes before a meal. Colonoscopy to be scheduled.  Call with up to date medication list.

## 2023-07-29 ENCOUNTER — Other Ambulatory Visit: Payer: Self-pay | Admitting: *Deleted

## 2023-07-29 ENCOUNTER — Encounter: Payer: Self-pay | Admitting: *Deleted

## 2023-07-29 MED ORDER — CLENPIQ 10-3.5-12 MG-GM -GM/175ML PO SOLN: 1.0000 | ORAL | 0 refills | Status: AC

## 2023-07-30 ENCOUNTER — Encounter: Payer: Self-pay | Admitting: *Deleted

## 2023-08-27 NOTE — Patient Instructions (Signed)
 Sharon Macdonald  08/27/2023     @PREFPERIOPPHARMACY @   Your procedure is scheduled on  09/01/2023.   Report to Sharon Macdonald at  1115 A.M.   Call this number if you have problems the morning of surgery:  [phone]  If you experience any cold or flu symptoms such as cough, fever, chills, shortness of breath, etc. between now and your scheduled surgery, please notify us  at the above number.   Remember:  Follow the diet and prep instructions given to you by the office.   You may drink clear liquids until 0915 am on 09/01/2023.     Clear liquids allowed are:                    Water, Juice (No red color; non-citric and without pulp; diabetics please choose diet or no sugar options), Carbonated beverages (diabetics please choose diet or no sugar options), Clear Tea (No creamer, milk, or cream, including half & half and powdered creamer), Black Coffee Only (No creamer, milk or cream, including half & half and powdered creamer), and Clear Sports drink (No red color; diabetics please choose diet or no sugar options)    Take these medicines the morning of surgery with A SIP OF WATER                              buspirone , levothyroxine .    Do not wear jewelry, make-up or nail polish, including gel polish,  artificial nails, or any other type of covering on natural nails (fingers and  toes).  Do not wear lotions, powders, or perfumes, or deodorant.  Do not shave 48 hours prior to surgery.  Men may shave face and neck.  Do not bring valuables to the hospital.  [facility] is not responsible for any belongings or valuables.  Contacts, dentures or bridgework may not be worn into surgery.  Leave your suitcase in the car.  After surgery it may be brought to your room.  For patients admitted to the hospital, discharge time will be determined by your treatment team.  Patients discharged the day of surgery will not be allowed to drive home and must have someone with them for 24 hours.     Special instructions:   DO NOT smoke tobacco or vape for 24 hours before your procedure.  Please read over the following fact sheets that you were given. Anesthesia Post-op Instructions and Care and Recovery After Surgery      Colonoscopy, Adult, Care After The following information offers guidance on how to care for yourself after your procedure. Your health care provider may also give you more specific instructions. If you have problems or questions, contact your health care provider. What can I expect after the procedure? After the procedure, it is common to have: A small amount of blood in your stool for 24 hours after the procedure. Some gas. Mild cramping or bloating of your abdomen. Follow these instructions at home: Eating and drinking  Drink enough fluid to keep your urine pale yellow. Follow instructions from your health care provider about eating or drinking restrictions. Resume your normal diet as told by your health care provider. Avoid heavy or fried foods that are hard to digest. Activity Rest as told by your health care provider. Avoid sitting for a long time without moving. Get up to take short walks every 1-2 hours. This is important to improve  blood flow and breathing. Ask for help if you feel weak or unsteady. Return to your normal activities as told by your health care provider. Ask your health care provider what activities are safe for you. Managing cramping and bloating  Try walking around when you have cramps or feel bloated. If directed, apply heat to your abdomen as told by your health care provider. Use the heat source that your health care provider recommends, such as a moist heat pack or a heating pad. Place a towel between your skin and the heat source. Leave the heat on for 20-30 minutes. Remove the heat if your skin turns bright red. This is especially important if you are unable to feel pain, heat, or cold. You have a greater risk of getting  burned. General instructions If you were given a sedative during the procedure, it can affect you for several hours. Do not drive or operate machinery until your health care provider says that it is safe. For the first 24 hours after the procedure: Do not sign important documents. Do not drink alcohol. Do your regular daily activities at a slower pace than normal. Eat soft foods that are easy to digest. Take over-the-counter and prescription medicines only as told by your health care provider. Keep all follow-up visits. This is important. Contact a health care provider if: You have blood in your stool 2-3 days after the procedure. Get help right away if: You have more than a small spotting of blood in your stool. You have large blood clots in your stool. You have swelling of your abdomen. You have nausea or vomiting. You have a fever. You have increasing pain in your abdomen that is not relieved with medicine. These symptoms may be an emergency. Get help right away. Call 911. Do not wait to see if the symptoms will go away. Do not drive yourself to the hospital. Summary After the procedure, it is common to have a small amount of blood in your stool. You may also have mild cramping and bloating of your abdomen. If you were given a sedative during the procedure, it can affect you for several hours. Do not drive or operate machinery until your health care provider says that it is safe. Get help right away if you have a lot of blood in your stool, nausea or vomiting, a fever, or increased pain in your abdomen. This information is not intended to replace advice given to you by your health care provider. Make sure you discuss any questions you have with your health care provider. Document Revised: 09/22/2022 Document Reviewed: 04/02/2021 Elsevier Patient Education  2024 [employer] Monitored Anesthesia Care, Care After The following information offers guidance on how to care for yourself  after your procedure. Your health care provider may also give you more specific instructions. If you have problems or questions, contact your health care provider. What can I expect after the procedure? After the procedure, it is common to have: Tiredness. Little or no memory about what happened during or after the procedure. Impaired judgment when it comes to making decisions. Nausea or vomiting. Some trouble with balance. Follow these instructions at home: For the time period you were told by your health care provider:  Rest. Do not participate in activities where you could fall or become injured. Do not drive or use machinery. Do not drink alcohol. Do not take sleeping pills or medicines that cause drowsiness. Do not make important decisions or sign legal documents. Do not take care  of children on your own. Medicines Take over-the-counter and prescription medicines only as told by your health care provider. If you were prescribed antibiotics, take them as told by your health care provider. Do not stop using the antibiotic even if you start to feel better. Eating and drinking Follow instructions from your health care provider about what you may eat and drink. Drink enough fluid to keep your urine pale yellow. If you vomit: Drink clear fluids slowly and in small amounts as you are able. Clear fluids include water, ice chips, low-calorie sports drinks, and fruit juice that has water added to it (diluted fruit juice). Eat light and bland foods in small amounts as you are able. These foods include bananas, applesauce, rice, lean meats, toast, and crackers. General instructions  Have a responsible adult stay with you for the time you are told. It is important to have someone help care for you until you are awake and alert. If you have sleep apnea, surgery and some medicines can increase your risk for breathing problems. Follow instructions from your health care provider about wearing your  sleep device: When you are sleeping. This includes during daytime naps. While taking prescription pain medicines, sleeping medicines, or medicines that make you drowsy. Do not use any products that contain nicotine or tobacco. These products include cigarettes, chewing tobacco, and vaping devices, such as e-cigarettes. If you need help quitting, ask your health care provider. Contact a health care provider if: You feel nauseous or vomit every time you eat or drink. You feel light-headed. You are still sleepy or having trouble with balance after 24 hours. You get a rash. You have a fever. You have redness or swelling around the IV site. Get help right away if: You have trouble breathing. You have new confusion after you get home. These symptoms may be an emergency. Get help right away. Call 911. Do not wait to see if the symptoms will go away. Do not drive yourself to the hospital. This information is not intended to replace advice given to you by your health care provider. Make sure you discuss any questions you have with your health care provider. Document Revised: 01/05/2022 Document Reviewed: 01/05/2022 Elsevier Patient Education  2024 [employer].

## 2023-08-30 ENCOUNTER — Encounter (HOSPITAL_COMMUNITY): Payer: Self-pay

## 2023-08-30 ENCOUNTER — Encounter (HOSPITAL_COMMUNITY)
Admission: RE | Admit: 2023-08-30 | Discharge: 2023-08-30 | Disposition: A | Discharge status: Home / Self Care | Payer: Medicaid Other | Source: Ambulatory Visit | Attending: Internal Medicine | Admitting: Internal Medicine

## 2023-08-30 ENCOUNTER — Telehealth: Payer: Self-pay | Admitting: *Deleted

## 2023-08-30 VITALS — Ht 68.0 in | Wt 284.4 lb

## 2023-08-30 DIAGNOSIS — R638 Other symptoms and signs concerning food and fluid intake: Secondary | ICD-10-CM

## 2023-08-30 NOTE — Telephone Encounter (Signed)
 Pt called in and wanted to reschedule her procedure for Wednesday with Dr. Jena Gauss.  Moved to 2/26 at 9:30am. Aware will call back with pre-op appt and send new instructions.

## 2023-08-31 ENCOUNTER — Encounter: Payer: Self-pay | Admitting: *Deleted

## 2023-08-31 NOTE — Telephone Encounter (Signed)
 Called and LMTCB. Pre-op appt letter mailed

## 2023-10-11 ENCOUNTER — Encounter: Payer: Self-pay | Admitting: *Deleted

## 2023-10-11 ENCOUNTER — Telehealth: Payer: Self-pay | Admitting: *Deleted

## 2023-10-11 NOTE — Telephone Encounter (Signed)
 Pt has been rescheduled for 11/17/23. Updated instructions mailed.    Message Received: Today Young, Torrie Mayers, Cashlyn Huguley L, LPN Patient's husband LM that they want to reschedule.  Thanks,

## 2023-10-18 ENCOUNTER — Encounter (HOSPITAL_COMMUNITY): Payer: Medicaid Other

## 2023-11-02 ENCOUNTER — Ambulatory Visit: Payer: Self-pay | Admitting: Internal Medicine

## 2023-11-15 ENCOUNTER — Telehealth: Payer: Self-pay | Admitting: *Deleted

## 2023-11-15 ENCOUNTER — Encounter (HOSPITAL_COMMUNITY): Payer: Self-pay

## 2023-11-15 ENCOUNTER — Encounter (HOSPITAL_COMMUNITY)
Admission: RE | Admit: 2023-11-15 | Discharge: 2023-11-15 | Disposition: A | Discharge status: Home / Self Care | Payer: Medicaid Other | Source: Ambulatory Visit | Attending: Internal Medicine | Admitting: Internal Medicine

## 2023-11-15 NOTE — Telephone Encounter (Signed)
 Spoke with pt. She stated she didn't have a call and requested a call to her number. I have sent message to Selena Batten in endo letting her know

## 2023-11-15 NOTE — Pre-Procedure Instructions (Signed)
 Attempted pre-op phonecall. Left VM for her to call us back.

## 2023-11-15 NOTE — Patient Instructions (Signed)
 Sharon Macdonald  11/15/2023     @PREFPERIOPPHARMACY @   Your procedure is scheduled on  11/17/2023.   Report to [facility] at  1100 A.M.   Call this number if you have problems the morning of surgery:  [phone]  If you experience any cold or flu symptoms such as cough, fever, chills, shortness of breath, etc. between now and your scheduled surgery, please notify us at the above number.   Remember:  Follow the diet and prep instructions given to you by the office.   You may drink clear liquids until 0900 am on 11/17/2023.    Clear liquids allowed are:                    Water, Juice (No red color; non-citric and without pulp; diabetics please choose diet or no sugar options), Carbonated beverages (diabetics please choose diet or no sugar options), Clear Tea (No creamer, milk, or cream, including half & half and powdered creamer), Black Coffee Only (No creamer, milk or cream, including half & half and powdered creamer), and Clear Sports drink (No red color; diabetics please choose diet or no sugar options)    Take these medicines the morning of surgery with A SIP OF WATER                                        buspar, levothyroxine.    Do not wear jewelry, make-up or nail polish, including gel polish,  artificial nails, or any other type of covering on natural nails (fingers and  toes).  Do not wear lotions, powders, or perfumes, or deodorant.  Do not shave 48 hours prior to surgery.  Men may shave face and neck.  Do not bring valuables to the hospital.  [facility] is not responsible for any belongings or valuables.  Contacts, dentures or bridgework may not be worn into surgery.  Leave your suitcase in the car.  After surgery it may be brought to your room.  For patients admitted to the hospital, discharge time will be determined by your treatment team.  Patients discharged the day of surgery will not be allowed to drive home and must have someone with them for 24  hours.    Special instructions:   DO NOT smoke tobacco or vape for 24 hours before your procedure.  Please read over the following fact sheets that you were given. Anesthesia Post-op Instructions and Care and Recovery After Surgery      Colonoscopy, Adult, Care After The following information offers guidance on how to care for yourself after your procedure. Your health care provider may also give you more specific instructions. If you have problems or questions, contact your health care provider. What can I expect after the procedure? After the procedure, it is common to have: A small amount of blood in your stool for 24 hours after the procedure. Some gas. Mild cramping or bloating of your abdomen. Follow these instructions at home: Eating and drinking  Drink enough fluid to keep your urine pale yellow. Follow instructions from your health care provider about eating or drinking restrictions. Resume your normal diet as told by your health care provider. Avoid heavy or fried foods that are hard to digest. Activity Rest as told by your health care provider. Avoid sitting for a long time without moving. Get up to take  short walks every 1-2 hours. This is important to improve blood flow and breathing. Ask for help if you feel weak or unsteady. Return to your normal activities as told by your health care provider. Ask your health care provider what activities are safe for you. Managing cramping and bloating  Try walking around when you have cramps or feel bloated. If directed, apply heat to your abdomen as told by your health care provider. Use the heat source that your health care provider recommends, such as a moist heat pack or a heating pad. Place a towel between your skin and the heat source. Leave the heat on for 20-30 minutes. Remove the heat if your skin turns bright red. This is especially important if you are unable to feel pain, heat, or cold. You have a greater risk of getting  burned. General instructions If you were given a sedative during the procedure, it can affect you for several hours. Do not drive or operate machinery until your health care provider says that it is safe. For the first 24 hours after the procedure: Do not sign important documents. Do not drink alcohol. Do your regular daily activities at a slower pace than normal. Eat soft foods that are easy to digest. Take over-the-counter and prescription medicines only as told by your health care provider. Keep all follow-up visits. This is important. Contact a health care provider if: You have blood in your stool 2-3 days after the procedure. Get help right away if: You have more than a small spotting of blood in your stool. You have large blood clots in your stool. You have swelling of your abdomen. You have nausea or vomiting. You have a fever. You have increasing pain in your abdomen that is not relieved with medicine. These symptoms may be an emergency. Get help right away. Call 911. Do not wait to see if the symptoms will go away. Do not drive yourself to the hospital. Summary After the procedure, it is common to have a small amount of blood in your stool. You may also have mild cramping and bloating of your abdomen. If you were given a sedative during the procedure, it can affect you for several hours. Do not drive or operate machinery until your health care provider says that it is safe. Get help right away if you have a lot of blood in your stool, nausea or vomiting, a fever, or increased pain in your abdomen. This information is not intended to replace advice given to you by your health care provider. Make sure you discuss any questions you have with your health care provider. Document Revised: 09/22/2022 Document Reviewed: 04/02/2021 Elsevier Patient Education  2024 [employer].General Anesthesia, Adult, Care After The following information offers guidance on how to care for yourself  after your procedure. Your health care provider may also give you more specific instructions. If you have problems or questions, contact your health care provider. What can I expect after the procedure? After the procedure, it is common for people to: Have pain or discomfort at the IV site. Have nausea or vomiting. Have a sore throat or hoarseness. Have trouble concentrating. Feel cold or chills. Feel weak, sleepy, or tired (fatigue). Have soreness and body aches. These can affect parts of the body that were not involved in surgery. Follow these instructions at home: For the time period you were told by your health care provider:  Rest. Do not participate in activities where you could fall or become injured. Do not  drive or use machinery. Do not drink alcohol. Do not take sleeping pills or medicines that cause drowsiness. Do not make important decisions or sign legal documents. Do not take care of children on your own. General instructions Drink enough fluid to keep your urine pale yellow. If you have sleep apnea, surgery and certain medicines can increase your risk for breathing problems. Follow instructions from your health care provider about wearing your sleep device: Anytime you are sleeping, including during daytime naps. While taking prescription pain medicines, sleeping medicines, or medicines that make you drowsy. Return to your normal activities as told by your health care provider. Ask your health care provider what activities are safe for you. Take over-the-counter and prescription medicines only as told by your health care provider. Do not use any products that contain nicotine or tobacco. These products include cigarettes, chewing tobacco, and vaping devices, such as e-cigarettes. These can delay incision healing after surgery. If you need help quitting, ask your health care provider. Contact a health care provider if: You have nausea or vomiting that does not get better  with medicine. You vomit every time you eat or drink. You have pain that does not get better with medicine. You cannot urinate or have bloody urine. You develop a skin rash. You have a fever. Get help right away if: You have trouble breathing. You have chest pain. You vomit blood. These symptoms may be an emergency. Get help right away. Call 911. Do not wait to see if the symptoms will go away. Do not drive yourself to the hospital. Summary After the procedure, it is common to have a sore throat, hoarseness, nausea, vomiting, or to feel weak, sleepy, or fatigue. For the time period you were told by your health care provider, do not drive or use machinery. Get help right away if you have difficulty breathing, have chest pain, or vomit blood. These symptoms may be an emergency. This information is not intended to replace advice given to you by your health care provider. Make sure you discuss any questions you have with your health care provider. Document Revised: 11/07/2021 Document Reviewed: 11/07/2021 Elsevier Patient Education  2024 [employer].

## 2023-11-15 NOTE — Telephone Encounter (Signed)
-----   Message from Nurse Marinell Blight sent at 11/15/2023  1:57 PM EDT ----- Regarding: no show Good afternoon! Sharon Macdonald did not show for her pre-op. She can be a phone call, but she did not answer when I called her and hasn't returned my call.

## 2023-11-15 NOTE — Telephone Encounter (Signed)
 Error. Entered to wrong pt's chart.

## 2023-11-15 NOTE — Pre-Procedure Instructions (Signed)
 I called patient for pre-op hone call. Her husband states that he had, "talked to someone earlier in the month so they could reschedule". He does not know who he spoke to or it was the office or hospital. Office messaged to make them aware.

## 2023-11-15 NOTE — Telephone Encounter (Signed)
 Spouse/pt advised pre-op they were cancelling procedure wed 3/26. Called pt to confirm but did not state why. Procedure cancelled. Will need OV prior to rescheduling.

## 2023-11-15 NOTE — Telephone Encounter (Signed)
 Called [facility] and spoke with Summer H, No prior authorization needed for procedures. Reference # [id number]

## 2023-11-17 ENCOUNTER — Ambulatory Visit (HOSPITAL_COMMUNITY): Admission: RE | Admit: 2023-11-17 | Payer: Medicaid Other | Source: Home / Self Care | Admitting: Internal Medicine

## 2023-11-17 ENCOUNTER — Encounter (HOSPITAL_COMMUNITY): Admission: RE | Payer: Self-pay | Source: Home / Self Care

## 2023-11-17 SURGERY — COLONOSCOPY WITH PROPOFOL
Anesthesia: Choice

## 2024-01-13 ENCOUNTER — Ambulatory Visit (HOSPITAL_COMMUNITY)
Admission: RE | Admit: 2024-01-13 | Discharge: 2024-01-13 | Disposition: A | Discharge status: Home / Self Care | Source: Ambulatory Visit | Attending: Emergency Medicine | Admitting: Emergency Medicine

## 2024-01-13 ENCOUNTER — Other Ambulatory Visit (HOSPITAL_COMMUNITY): Payer: Self-pay | Admitting: Emergency Medicine

## 2024-01-13 DIAGNOSIS — R1084 Generalized abdominal pain: Secondary | ICD-10-CM

## 2024-01-13 MED ORDER — IOHEXOL 300 MG/ML  SOLN
100.0000 mL | Freq: Once | INTRAMUSCULAR | Status: AC | PRN
  Administered 2024-01-13: 100 mL via INTRAVENOUS

## 2024-04-04 ENCOUNTER — Ambulatory Visit

## 2024-04-04 VITALS — BP 128/61 | HR 73 | Ht 68.0 in | Wt 295.0 lb

## 2024-04-04 DIAGNOSIS — F419 Anxiety disorder, unspecified: Secondary | ICD-10-CM

## 2024-04-04 DIAGNOSIS — Z6841 Body Mass Index (BMI) 40.0 and over, adult: Secondary | ICD-10-CM | POA: Diagnosis not present

## 2024-04-04 DIAGNOSIS — E039 Hypothyroidism, unspecified: Secondary | ICD-10-CM

## 2024-04-04 MED ORDER — ESCITALOPRAM OXALATE 20 MG PO TABS: 20.0000 mg | ORAL_TABLET | Freq: Every day | ORAL | 1 refills | Status: AC

## 2024-04-04 MED ORDER — BUSPIRONE HCL 15 MG PO TABS: 15.0000 mg | ORAL_TABLET | Freq: Two times a day (BID) | ORAL | 1 refills | Status: AC | PRN

## 2024-04-04 MED ORDER — SEMAGLUTIDE-WEIGHT MANAGEMENT 1 MG/0.5ML ~~LOC~~ SOAJ: 1.0000 mg | SUBCUTANEOUS | 0 refills | Status: AC

## 2024-04-04 MED ORDER — SEMAGLUTIDE-WEIGHT MANAGEMENT 0.5 MG/0.5ML ~~LOC~~ SOAJ: 0.5000 mg | SUBCUTANEOUS | 0 refills | Status: AC

## 2024-04-04 MED ORDER — SEMAGLUTIDE-WEIGHT MANAGEMENT 2.4 MG/0.75ML ~~LOC~~ SOAJ: 2.4000 mg | SUBCUTANEOUS | 0 refills | Status: AC

## 2024-04-04 MED ORDER — SEMAGLUTIDE-WEIGHT MANAGEMENT 0.25 MG/0.5ML ~~LOC~~ SOAJ: 0.2500 mg | SUBCUTANEOUS | 0 refills | Status: AC

## 2024-04-04 MED ORDER — LEVOTHYROXINE SODIUM 137 MCG PO TABS
137.0000 ug | ORAL_TABLET | Freq: Every day | ORAL | 1 refills | Status: AC
Start: 2024-04-04 — End: ?

## 2024-04-04 MED ORDER — SEMAGLUTIDE-WEIGHT MANAGEMENT 1.7 MG/0.75ML ~~LOC~~ SOAJ: 1.7000 mg | SUBCUTANEOUS | 0 refills | Status: AC

## 2024-04-04 NOTE — Progress Notes (Unsigned)
 New Patient Office Visit  Subjective    Patient ID: Sharon Macdonald, female    DOB: 1965-09-17  Age: 58 y.o. MRN: [id number]  CC:  Chief Complaint  Patient presents with   Establish Care    HPI THERESIA PREE presents to establish care Discussed the use of AI scribe software for clinical note transcription with the patient, who gave verbal consent to proceed.  History of Present Illness   Sharon Macdonald is a 58 year old female who presents with gastrointestinal discomfort and sleep disturbances.  Gastrointestinal symptoms - Gastrointestinal discomfort characterized by dull pain and sensation of movement in the abdomen, particularly noticeable at night - Regular bowel movements with associated pain and discomfort - History of colonoscopy in 2020; awaiting rescheduling for another procedure - Previous use of Wegovy  resulted in abdominal swelling attributed to yeast intake; interested in retrying Wegovy  for weight management  Sleep disturbances and paresthesias - Sleep disturbances due to sensations in the legs described as 'nerves in my legs feels like something's biting me or something's sticking me with a needle' - Symptoms occur primarily at night but can also happen during the day depending on duration of sitting - Movement alleviates leg symptoms  Musculoskeletal pain - Soreness in the back, particularly in the middle, radiating to the belly, more pronounced on the right side - Occasional neck pain  Medication history and adverse effects - Current medications include dicyclomine , levothyroxine  137 mcg, Buspar  (buspirone ) once daily for anxiety, and Lexapro  for anxiety - Previous use of gabapentin for nerve pain discontinued due to excessive sedation - Use of Flexeril as a muscle relaxer  Weight concerns - Concern regarding weight and its impact on health - Interest in weight management options including Wegovy        Outpatient Encounter Medications as of 04/04/2024   Medication Sig   dicyclomine  (BENTYL ) 10 MG capsule Take 10 mg by mouth 3 (three) times daily.   escitalopram  (LEXAPRO ) 20 MG tablet Take 1 tablet (20 mg total) by mouth daily.   levothyroxine  (SYNTHROID ) 137 MCG tablet Take 1 tablet (137 mcg total) by mouth daily.   semaglutide -weight management (WEGOVY ) 0.25 MG/0.5ML SOAJ SQ injection Inject 0.25 mg into the skin once a week for 28 days.   [START ON 05/03/2024] semaglutide -weight management (WEGOVY ) 0.5 MG/0.5ML SOAJ SQ injection Inject 0.5 mg into the skin once a week for 28 days.   [START ON 06/01/2024] semaglutide -weight management (WEGOVY ) 1 MG/0.5ML SOAJ SQ injection Inject 1 mg into the skin once a week for 28 days.   [START ON 06/30/2024] semaglutide -weight management (WEGOVY ) 1.7 MG/0.75ML SOAJ SQ injection Inject 1.7 mg into the skin once a week for 28 days.   [START ON 07/29/2024] semaglutide -weight management (WEGOVY ) 2.4 MG/0.75ML SOAJ SQ injection Inject 2.4 mg into the skin once a week for 28 days.   Sod Picosulfate-Mag Ox-Cit Acd (CLENPIQ ) 10-3.5-12 MG-GM -GM/175ML SOLN Take 1 kit by mouth as directed.   Vitamin D, Ergocalciferol, (DRISDOL) 1.25 MG (50000 UNIT) CAPS capsule Take 50,000 Units by mouth 2 (two) times a week.   [DISCONTINUED] busPIRone  (BUSPAR ) 15 MG tablet Take 15 mg by mouth 2 (two) times daily as needed (anxiety).    [DISCONTINUED] levothyroxine  (SYNTHROID ) 137 MCG tablet Take 137 mcg by mouth every morning.   busPIRone  (BUSPAR ) 15 MG tablet Take 1 tablet (15 mg total) by mouth 2 (two) times daily as needed (anxiety).   hyoscyamine  (LEVSIN ) 0.125 MG tablet Take 1 tablet (0.125 mg total) by  mouth every 6 (six) hours as needed for cramping (Hold if you have constipation or go more than 24 hours without a bowel movement.).   [DISCONTINUED] cyanocobalamin (VITAMIN B12) 1000 MCG tablet Take 1,000 mcg by mouth daily. (Patient not taking: Reported on 04/04/2024)   [DISCONTINUED] cyclobenzaprine (FLEXERIL) 10 MG tablet Take 10  mg by mouth at bedtime. (Patient not taking: Reported on 07/28/2023)   [DISCONTINUED] escitalopram  (LEXAPRO ) 20 MG tablet Take 20 mg by mouth daily. (Patient not taking: Reported on 07/28/2023)   [DISCONTINUED] folic acid (FOLVITE) 1 MG tablet Take 1 mg by mouth daily. (Patient not taking: Reported on 07/28/2023)   [DISCONTINUED] gabapentin (NEURONTIN) 300 MG capsule Take 300 mg by mouth at bedtime as needed.  (Patient not taking: Reported on 07/28/2023)   [DISCONTINUED] ondansetron  (ZOFRAN ) 4 MG tablet Take 1 tablet (4 mg total) by mouth every 8 (eight) hours as needed for nausea or vomiting. (Patient not taking: Reported on 07/28/2023)   [DISCONTINUED] pramipexole (MIRAPEX) 0.75 MG tablet Take 0.75 mg by mouth at bedtime. (Patient not taking: Reported on 07/28/2023)   No facility-administered encounter medications on file as of 04/04/2024.    Past Medical History:  Diagnosis Date   Anxiety 07/13/2017   IBS (irritable bowel syndrome)    Restless leg syndrome    SBO (small bowel obstruction) (HCC)    Thyroid disease    Uterine leiomyoma     Past Surgical History:  Procedure Laterality Date   APPENDECTOMY     BIOPSY  05/08/2019   Procedure: BIOPSY;  Surgeon: Sharon Lamar HERO, MD;  Location: [facility];  Service: Endoscopy;;   CHOLECYSTECTOMY     COLONOSCOPY WITH PROPOFOL  N/A 05/08/2019   Procedure: COLONOSCOPY WITH PROPOFOL ;  Surgeon: Sharon Lamar HERO, MD;  Location: [facility];  Service: Endoscopy;  Laterality: N/A;  1:45pm-office moved up to 10:15am    Family History  Problem Relation Age of Onset   Cancer Mother    Colon cancer Neg Hx     Social History   Socioeconomic History   Marital status: Married    Spouse name: Not on file   Number of children: Not on file   Years of education: Not on file   Highest education level: Not on file  Occupational History   Not on file  Tobacco Use   Smoking status: Every Day    Types: E-cigarettes   Smokeless tobacco: Never   Tobacco  comments:    e-ciggarettes daily  Substance and Sexual Activity   Alcohol use: No   Drug use: No   Sexual activity: Yes    Birth control/protection: Surgical  Other Topics Concern   Not on file  Social History Narrative   Not on file   Social Drivers of Health   Financial Resource Strain: Medium Risk (12/29/2022)   [facility]   Overall Financial Resource Strain (CARDIA)    Difficulty of Paying Living Expenses: Somewhat hard  Food Insecurity: Low Risk  (01/14/2024)   Received from [facility]   Hunger Vital Sign    Within the past 12 months, you worried that your food would run out before you got money to buy more: Never true    Within the past 12 months, the food you bought just didn't last and you didn't have money to get more. : Never true  Transportation Needs: No Transportation Needs (01/14/2024)   Received from [facility]    In the past 12 months,  has lack of reliable transportation kept you from medical appointments, meetings, work or from getting things needed for daily living? : No  Physical Activity: Not on file  Stress: Not on file  Social Connections: Not on file  Intimate Partner Violence: Not on file    ROS      Objective    BP 128/61   Pulse 73   Ht 5' 8 (1.727 m)   Wt 295 lb (133.8 kg)   LMP 08/30/2015 (Approximate)   SpO2 96%   BMI 44.85 kg/m   Physical Exam Vitals and nursing note reviewed.  Constitutional:      Appearance: Normal appearance. She is obese.  HENT:     Head: Normocephalic.  Eyes:     Extraocular Movements: Extraocular movements intact.     Pupils: Pupils are equal, round, and reactive to light.  Cardiovascular:     Rate and Rhythm: Normal rate and regular rhythm.  Pulmonary:     Effort: Pulmonary effort is normal.     Breath sounds: Normal breath sounds.  Musculoskeletal:     Cervical back: Normal range of motion and neck supple.  Neurological:     Mental Status: She is alert and  oriented to person, place, and time.  Psychiatric:        Mood and Affect: Mood normal.        Thought Content: Thought content normal.         Assessment & Plan:   Problem List Items Addressed This Visit       Endocrine   Hypothyroidism (Chronic)   Hypothyroidism managed with levothyroxine  137 mcg.  - Refill levothyroxine  137 mcg. - Plan blood work in 3-4 months.       Relevant Medications   levothyroxine  (SYNTHROID ) 137 MCG tablet     Other   Adult BMI 40.0-44.9 kg/sq m (HCC) - Primary   Obesity with previous Wegovy  trial discontinued due to yeast-related stomach swelling. Willing to retry with dietary changes.   Advised to increase intake of water and avoiding fatty foods or overeating.         Relevant Medications   semaglutide -weight management (WEGOVY ) 0.25 MG/0.5ML SOAJ SQ injection   semaglutide -weight management (WEGOVY ) 0.5 MG/0.5ML SOAJ SQ injection (Start on 05/03/2024)   semaglutide -weight management (WEGOVY ) 1 MG/0.5ML SOAJ SQ injection (Start on 06/01/2024)   semaglutide -weight management (WEGOVY ) 1.7 MG/0.75ML SOAJ SQ injection (Start on 06/30/2024)   semaglutide -weight management (WEGOVY ) 2.4 MG/0.75ML SOAJ SQ injection (Start on 07/29/2024)   Anxiety   Anxiety disorder managed with Lexapro  and Buspar . - Refill Lexapro  and Buspar . - Continue current dosing regimen.      Relevant Medications   escitalopram  (LEXAPRO ) 20 MG tablet   busPIRone  (BUSPAR ) 15 MG tablet   Assessment and Plan    Chronic abdominal pain and discomfort Chronic dull abdominal pain with bowel issues, predating Wegovy  use. Possible yeast-related swelling. - Consider rescheduling colonoscopy per GI schedule. - Monitor symptoms and dietary intake, especially yeast-containing foods.  Chronic back pain Chronic back pain with muscle tension, possibly related to left-hand dominance and prolonged sitting. - Consider using a body pillow for support during sleep.  Restless legs  syndrome Restless legs syndrome with nocturnal symptoms, improved with movement. Gabapentin caused sedation. - Discontinue gabapentin.  Obesity Obesity with previous Wegovy  trial discontinued due to yeast-related stomach swelling. Willing to retry with dietary changes.  Hypothyroidism Hypothyroidism managed with levothyroxine  137 mcg.  - Refill levothyroxine  137 mcg. - Plan blood work in 3-4 months.  Anxiety disorder Anxiety disorder managed with Lexapro  and Buspar . - Refill Lexapro  and Buspar . - Continue current dosing regimen.       Return in about 3 months (around 07/05/2024) for chronic follow-up with PCP.   Leita Longs, FNP

## 2024-04-14 NOTE — Assessment & Plan Note (Signed)
 Obesity with previous Wegovy  trial discontinued due to yeast-related stomach swelling. Willing to retry with dietary changes.   Advised to increase intake of water and avoiding fatty foods or overeating.

## 2024-04-14 NOTE — Assessment & Plan Note (Signed)
 Hypothyroidism managed with levothyroxine  137 mcg.  - Refill levothyroxine  137 mcg. - Plan blood work in 3-4 months.

## 2024-04-14 NOTE — Assessment & Plan Note (Signed)
 Anxiety disorder managed with Lexapro  and Buspar . - Refill Lexapro  and Buspar . - Continue current dosing regimen.

## 2024-05-02 ENCOUNTER — Ambulatory Visit

## 2024-05-02 VITALS — BP 135/81 | HR 70 | Temp 98.1°F | Ht 68.0 in | Wt 290.0 lb

## 2024-05-02 DIAGNOSIS — F419 Anxiety disorder, unspecified: Secondary | ICD-10-CM

## 2024-05-02 DIAGNOSIS — K50119 Crohn's disease of large intestine with unspecified complications: Secondary | ICD-10-CM | POA: Diagnosis not present

## 2024-05-02 DIAGNOSIS — R29898 Other symptoms and signs involving the musculoskeletal system: Secondary | ICD-10-CM | POA: Diagnosis not present

## 2024-05-02 DIAGNOSIS — E039 Hypothyroidism, unspecified: Secondary | ICD-10-CM

## 2024-05-02 MED ORDER — HYOSCYAMINE SULFATE 0.125 MG PO TABS
0.1250 mg | ORAL_TABLET | Freq: Four times a day (QID) | ORAL | 1 refills | Status: AC | PRN
Start: 2024-05-02 — End: ?

## 2024-05-02 MED ORDER — DICYCLOMINE HCL 10 MG PO CAPS: 10.0000 mg | ORAL_CAPSULE | Freq: Three times a day (TID) | ORAL | 3 refills | Status: AC

## 2024-05-02 NOTE — Progress Notes (Unsigned)
 Established Patient Office Visit  Subjective   Patient ID: Sharon Macdonald, female    DOB: 1965-11-23  Age: 58 y.o. MRN: [id number]  Chief Complaint  Patient presents with   Medical Management of Chronic Issues    Medication side effects, weakness in legs and dizziness     HPI Discussed the use of AI scribe software for clinical note transcription with the patient, who gave verbal consent to proceed.  History of Present Illness   Sharon Macdonald is a 58 year old female who presents with muscle weakness and anxiety exacerbation.  Muscle weakness - Muscle weakness in legs and arms for the past week - Difficulty lifting a chair and getting up from the floor - Currently taking vitamin D , unaware of muscle weakness as a potential side effect - Feeling weak and sweaty after a bowel movement last night  Anxiety and insomnia - Significant increase in anxiety following an episode of gastrointestinal discomfort - Anxiety severe enough to prevent relaxation and sleep - Difficulty falling asleep last night, did not sleep until the morning - History of anxiety - Recently started on Buspar , took only half a dose one night - Uncertain if Lexapro  was recently restarted - Currently taking bupropion 150 mg daily and hydroxyzine as needed - Concerned about the number of medications, sometimes feels 'like I'm drunk'  Gastrointestinal symptoms - History of gastrointestinal issues - Scheduled for endoscopy on October 8th to evaluate for Crohn's disease - Previous use of Bentyl  for stomach cramping, may need a refill  Upper respiratory symptoms - Symptoms of a cold including cough, sneezing, and runny nose, persisting for a long time  Thyroid dysfunction - Throat problems present - Currently taking thyroxine for thyroid management      Patient Active Problem List   Diagnosis Date Noted   Abnormal CT scan, small bowel 07/28/2023   Adult BMI 40.0-44.9 kg/sq m (HCC) 12/29/2022   Crohn's  colitis, unspecified complication (HCC) 04/08/2022   Renal insufficiency 04/05/2022   Anxiety 04/04/2022   Hypothyroidism 04/04/2022   Rectal pain 05/02/2019   Diarrhea 01/09/2019   Abdominal pain 01/09/2019   ROS   Objective:     BP 135/81   Pulse 70   Temp 98.1 F (36.7 C)   Ht 5' 8 (1.727 m)   Wt 290 lb 0.6 oz (131.6 kg)   LMP 08/30/2015 (Approximate)   SpO2 97%   BMI 44.10 kg/m  BP Readings from Last 3 Encounters:  05/02/24 135/81  04/04/24 128/61  07/28/23 113/77   Wt Readings from Last 3 Encounters:  05/02/24 290 lb 0.6 oz (131.6 kg)  04/04/24 295 lb (133.8 kg)  08/27/23 284 lb 6.3 oz (129 kg)    Physical Exam Vitals and nursing note reviewed.  Constitutional:      Appearance: Normal appearance.  HENT:     Head: Normocephalic.  Eyes:     Extraocular Movements: Extraocular movements intact.     Pupils: Pupils are equal, round, and reactive to light.  Cardiovascular:     Rate and Rhythm: Normal rate and regular rhythm.  Pulmonary:     Effort: Pulmonary effort is normal.     Breath sounds: Normal breath sounds.  Abdominal:     General: Bowel sounds are normal.     Palpations: Abdomen is soft.     Tenderness: There is no abdominal tenderness.  Musculoskeletal:        General: Normal range of motion.     Cervical back: Normal  range of motion and neck supple.     Right lower leg: No edema.     Left lower leg: No edema.  Skin:    General: Skin is warm and dry.  Neurological:     Mental Status: She is alert and oriented to person, place, and time.  Psychiatric:        Mood and Affect: Mood normal.        Thought Content: Thought content normal.      The ASCVD Risk score (Arnett DK, et al., 2019) failed to calculate for the following reasons:   Cannot find a previous HDL lab   Cannot find a previous total cholesterol lab    Assessment & Plan:   Problem List Items Addressed This Visit       Digestive   Crohn's colitis, unspecified  complication (HCC)   Stable with current dose of dicyclomine  10 mg and hyoscyamine  0.125 mg.  Refills provided today.   Recommend GI consult if symptoms are not well controlled.       Relevant Medications   dicyclomine  (BENTYL ) 10 MG capsule   hyoscyamine  (LEVSIN ) 0.125 MG tablet     Endocrine   Hypothyroidism (Chronic)   Hypothyroidism managed with levothyroxine  137 mcg.  - Recheck thyroid labs today.  Adjust dose if indicated.        Relevant Orders   TSH + free T4 (Completed)     Other   Anxiety   Anxiety exacerbated by gastrointestinal symptoms. Transitioning from Buspar  to bupropion and hydroxyzine as needed. - Wean off Buspar . - Start bupropion 150 mg daily. - Use hydroxyzine as needed.      Other Visit Diagnoses       Weakness of extremity    -  Primary   Muscle weakness in legs and arms for about a week.. will obtain labs for further evaluation.   Relevant Orders   CMP14+EGFR (Completed)   CBC with Differential/Platelet (Completed)   B12 and Folate Panel (Completed)   Magnesium (Completed)   Vitamin D  (25 hydroxy) (Completed)            No follow-ups on file.    Leita Longs, FNP

## 2024-05-03 LAB — CMP14+EGFR
ALT: 16 IU/L (ref 0–32)
AST: 16 IU/L (ref 0–40)
Albumin: 3.8 g/dL (ref 3.8–4.9)
Alkaline Phosphatase: 114 IU/L (ref 44–121)
BUN/Creatinine Ratio: 5 — ABNORMAL LOW (ref 9–23)
BUN: 4 mg/dL — ABNORMAL LOW (ref 6–24)
Bilirubin Total: 1.7 mg/dL — ABNORMAL HIGH (ref 0.0–1.2)
CO2: 24 mmol/L (ref 20–29)
Calcium: 9.1 mg/dL (ref 8.7–10.2)
Chloride: 98 mmol/L (ref 96–106)
Creatinine, Ser: 0.79 mg/dL (ref 0.57–1.00)
Globulin, Total: 3.4 g/dL (ref 1.5–4.5)
Glucose: 110 mg/dL — ABNORMAL HIGH (ref 70–99)
Potassium: 3.2 mmol/L — ABNORMAL LOW (ref 3.5–5.2)
Sodium: 141 mmol/L (ref 134–144)
Total Protein: 7.2 g/dL (ref 6.0–8.5)
eGFR: 88 mL/min/1.73 (ref >59)

## 2024-05-03 LAB — CBC WITH DIFFERENTIAL/PLATELET
Basophils Absolute: 0.1 x10E3/uL (ref 0.0–0.2)
Basos: 1 %
EOS (ABSOLUTE): 0.2 x10E3/uL (ref 0.0–0.4)
Eos: 1 %
Hematocrit: 39.4 % (ref 34.0–46.6)
Hemoglobin: 13.1 g/dL (ref 11.1–15.9)
Immature Grans (Abs): 0 x10E3/uL (ref 0.0–0.1)
Immature Granulocytes: 0 %
Lymphocytes Absolute: 1.9 x10E3/uL (ref 0.7–3.1)
Lymphs: 18 %
MCH: 29.4 pg (ref 26.6–33.0)
MCHC: 33.2 g/dL (ref 31.5–35.7)
MCV: 89 fL (ref 79–97)
Monocytes Absolute: 1 x10E3/uL — ABNORMAL HIGH (ref 0.1–0.9)
Monocytes: 10 %
Neutrophils Absolute: 7.4 x10E3/uL — ABNORMAL HIGH (ref 1.4–7.0)
Neutrophils: 70 %
Platelets: 245 x10E3/uL (ref 150–450)
RBC: 4.45 x10E6/uL (ref 3.77–5.28)
RDW: 13.3 % (ref 11.7–15.4)
WBC: 10.5 x10E3/uL (ref 3.4–10.8)

## 2024-05-03 LAB — TSH+FREE T4
Free T4: 1.08 ng/dL (ref 0.82–1.77)
TSH: 3.08 u[IU]/mL (ref 0.450–4.500)

## 2024-05-03 LAB — B12 AND FOLATE PANEL
Folate: 14.7 ng/mL (ref >3.0)
Vitamin B-12: 300 pg/mL (ref 232–1245)

## 2024-05-03 LAB — MAGNESIUM: Magnesium: 1.7 mg/dL (ref 1.6–2.3)

## 2024-05-03 LAB — VITAMIN D 25 HYDROXY (VIT D DEFICIENCY, FRACTURES): Vit D, 25-Hydroxy: 22.8 ng/mL — ABNORMAL LOW (ref 30.0–100.0)

## 2024-05-07 ENCOUNTER — Ambulatory Visit: Payer: Self-pay

## 2024-05-07 ENCOUNTER — Other Ambulatory Visit: Payer: Self-pay

## 2024-05-07 DIAGNOSIS — E559 Vitamin D deficiency, unspecified: Secondary | ICD-10-CM

## 2024-05-07 DIAGNOSIS — E876 Hypokalemia: Secondary | ICD-10-CM

## 2024-05-07 MED ORDER — VITAMIN D (ERGOCALCIFEROL) 1.25 MG (50000 UNIT) PO CAPS: 50000.0000 [IU] | ORAL_CAPSULE | ORAL | 5 refills | Status: AC

## 2024-05-07 MED ORDER — POTASSIUM CHLORIDE CRYS ER 20 MEQ PO TBCR: 20.0000 meq | EXTENDED_RELEASE_TABLET | Freq: Every day | ORAL | 3 refills | Status: AC

## 2024-05-07 NOTE — Assessment & Plan Note (Signed)
 Anxiety exacerbated by gastrointestinal symptoms. Transitioning from Buspar  to bupropion and hydroxyzine as needed. - Wean off Buspar . - Start bupropion 150 mg daily. - Use hydroxyzine as needed.

## 2024-05-07 NOTE — Assessment & Plan Note (Signed)
 Hypothyroidism managed with levothyroxine  137 mcg.  - Recheck thyroid labs today.  Adjust dose if indicated.

## 2024-05-07 NOTE — Assessment & Plan Note (Signed)
 Stable with current dose of dicyclomine  10 mg and hyoscyamine  0.125 mg.  Refills provided today.   Recommend GI consult if symptoms are not well controlled.

## 2024-06-07 ENCOUNTER — Ambulatory Visit

## 2024-06-19 ENCOUNTER — Telehealth: Payer: Self-pay

## 2024-06-19 NOTE — Telephone Encounter (Unsigned)
 Copied from CRM [id number]. Topic: Appointments - Scheduling Inquiry for Clinic >> Jun 19, 2024  3:31 PM [name] wrote: Reason for CRM: Patient's fiance Sharon Macdonald called requesting to set a medication refill appointment for patient. Patient originally had a follow up appointment on 06-07-2024 but had to cancel due to him being in hospital. Sharon Macdonald would like to see if it's possible patient could be seen the same day as his appointment which is schedule for Nov. 7 at 11am. Advised him there were no availiable openings until Feb. but he insist in placing a request to see if it's possible. Please follow up with request at [phone]

## 2024-06-19 NOTE — Telephone Encounter (Signed)
 Called needs to get meds from her gastro. In [place]

## 2024-08-30 ENCOUNTER — Telehealth: Payer: Self-pay

## 2024-08-30 ENCOUNTER — Ambulatory Visit: Payer: Self-pay | Admitting: Nurse Practitioner

## 2024-08-30 NOTE — Telephone Encounter (Signed)
 Noted

## 2024-08-30 NOTE — Telephone Encounter (Unsigned)
 Copied from CRM [id number]. Topic: Clinical - Refused Triage >> Aug 30, 2024  7:50 AM Deaijah H wrote: Patient/caller voiced complaints of Diarrhea. Refused triage due to already going to [facility] for issue.. Declined transfer to triage.

## 2024-09-22 ENCOUNTER — Ambulatory Visit

## 2024-11-23 ENCOUNTER — Ambulatory Visit

## 2024-12-18 ENCOUNTER — Ambulatory Visit
